# Patient Record
Sex: Female | Born: 2005 | Race: White | Hispanic: No | Marital: Single | Smoking: Never smoker
Health system: Southern US, Community
[De-identification: ages and names within clinical notes are randomized; demographics above are authoritative.]

## PROBLEM LIST (undated history)

## (undated) DIAGNOSIS — F909 Attention-deficit hyperactivity disorder, unspecified type: Secondary | ICD-10-CM

## (undated) DIAGNOSIS — F319 Bipolar disorder, unspecified: Secondary | ICD-10-CM

## (undated) DIAGNOSIS — F32A Depression, unspecified: Secondary | ICD-10-CM

## (undated) DIAGNOSIS — F419 Anxiety disorder, unspecified: Secondary | ICD-10-CM

## (undated) DIAGNOSIS — R51 Headache: Secondary | ICD-10-CM

## (undated) DIAGNOSIS — R519 Headache, unspecified: Secondary | ICD-10-CM

---
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
## 2024-05-19 ENCOUNTER — Emergency Department
Admission: EM | Admit: 2024-05-19 | Discharge: 2024-05-20 | Disposition: A | Discharge status: Other Acute Inpatient Hospital | Payer: MEDICAID | Attending: Emergency Medicine | Admitting: Emergency Medicine

## 2024-05-19 DIAGNOSIS — X838XXA Intentional self-harm by other specified means, initial encounter: Secondary | ICD-10-CM | POA: Diagnosis not present

## 2024-05-19 DIAGNOSIS — F39 Unspecified mood [affective] disorder: Secondary | ICD-10-CM | POA: Diagnosis not present

## 2024-05-19 DIAGNOSIS — T50902A Poisoning by unspecified drugs, medicaments and biological substances, intentional self-harm, initial encounter: Secondary | ICD-10-CM | POA: Diagnosis present

## 2024-05-19 DIAGNOSIS — F3481 Disruptive mood dysregulation disorder: Secondary | ICD-10-CM | POA: Insufficient documentation

## 2024-05-19 DIAGNOSIS — T38892A Poisoning by other hormones and synthetic substitutes, intentional self-harm, initial encounter: Secondary | ICD-10-CM | POA: Insufficient documentation

## 2024-05-19 DIAGNOSIS — F29 Unspecified psychosis not due to a substance or known physiological condition: Secondary | ICD-10-CM | POA: Diagnosis not present

## 2024-05-19 DIAGNOSIS — Z79899 Other long term (current) drug therapy: Secondary | ICD-10-CM | POA: Diagnosis not present

## 2024-05-19 DIAGNOSIS — R4689 Other symptoms and signs involving appearance and behavior: Secondary | ICD-10-CM | POA: Diagnosis present

## 2024-05-19 NOTE — ED Notes (Signed)
 Past Medical History:  Diagnosis Date   ADHD (attention deficit hyperactivity disorder)    Anxiety    Bipolar 1 disorder (HCC)    Depression    Headache

## 2024-05-19 NOTE — ED Notes (Signed)
 Janice Dixon, Poison Control contacted. Recommendations for patient include EKG, VS, and Tylenol  levels at 0330. Expect mild drowsiness, but if patient becomes sedative, question additional medications.

## 2024-05-20 ENCOUNTER — Encounter: Payer: Self-pay | Admitting: Psychiatry

## 2024-05-20 ENCOUNTER — Encounter: Payer: Self-pay | Admitting: Emergency Medicine

## 2024-05-20 ENCOUNTER — Other Ambulatory Visit: Payer: Self-pay

## 2024-05-20 ENCOUNTER — Inpatient Hospital Stay
Admission: AD | Admit: 2024-05-20 | Discharge: 2024-05-25 | DRG: 885 | Disposition: A | Discharge status: Home / Self Care | Payer: MEDICAID | Source: Intra-hospital | Attending: Psychiatry | Admitting: Psychiatry

## 2024-05-20 DIAGNOSIS — E66812 Obesity, class 2: Secondary | ICD-10-CM | POA: Diagnosis present

## 2024-05-20 DIAGNOSIS — F3481 Disruptive mood dysregulation disorder: Secondary | ICD-10-CM | POA: Diagnosis not present

## 2024-05-20 DIAGNOSIS — F17290 Nicotine dependence, other tobacco product, uncomplicated: Secondary | ICD-10-CM | POA: Diagnosis present

## 2024-05-20 DIAGNOSIS — F419 Anxiety disorder, unspecified: Secondary | ICD-10-CM | POA: Diagnosis present

## 2024-05-20 DIAGNOSIS — F39 Unspecified mood [affective] disorder: Secondary | ICD-10-CM

## 2024-05-20 DIAGNOSIS — Z880 Allergy status to penicillin: Secondary | ICD-10-CM

## 2024-05-20 DIAGNOSIS — Z9151 Personal history of suicidal behavior: Secondary | ICD-10-CM | POA: Diagnosis not present

## 2024-05-20 DIAGNOSIS — Z6836 Body mass index (BMI) 36.0-36.9, adult: Secondary | ICD-10-CM | POA: Diagnosis not present

## 2024-05-20 DIAGNOSIS — Z9152 Personal history of nonsuicidal self-harm: Secondary | ICD-10-CM | POA: Diagnosis not present

## 2024-05-20 DIAGNOSIS — F3181 Bipolar II disorder: Principal | ICD-10-CM | POA: Diagnosis present

## 2024-05-20 DIAGNOSIS — T1491XA Suicide attempt, initial encounter: Secondary | ICD-10-CM | POA: Diagnosis present

## 2024-05-20 DIAGNOSIS — F909 Attention-deficit hyperactivity disorder, unspecified type: Secondary | ICD-10-CM | POA: Diagnosis present

## 2024-05-20 DIAGNOSIS — R519 Headache, unspecified: Secondary | ICD-10-CM | POA: Diagnosis present

## 2024-05-20 DIAGNOSIS — F319 Bipolar disorder, unspecified: Principal | ICD-10-CM | POA: Diagnosis present

## 2024-05-20 DIAGNOSIS — F1290 Cannabis use, unspecified, uncomplicated: Secondary | ICD-10-CM | POA: Diagnosis present

## 2024-05-20 DIAGNOSIS — Z79899 Other long term (current) drug therapy: Secondary | ICD-10-CM | POA: Diagnosis not present

## 2024-05-20 DIAGNOSIS — R4586 Emotional lability: Secondary | ICD-10-CM | POA: Diagnosis present

## 2024-05-20 LAB — COMPREHENSIVE METABOLIC PANEL WITH GFR
ALT: 14 U/L (ref 0–44)
AST: 22 U/L (ref 15–41)
Albumin: 3.8 g/dL (ref 3.5–5.0)
Alkaline Phosphatase: 62 U/L (ref 38–126)
Anion gap: 12 (ref 5–15)
BUN: 10 mg/dL (ref 6–20)
CO2: 21 mmol/L — ABNORMAL LOW (ref 22–32)
Calcium: 9 mg/dL (ref 8.9–10.3)
Chloride: 108 mmol/L (ref 98–111)
Creatinine, Ser: 0.48 mg/dL (ref 0.44–1.00)
GFR, Estimated: >60 mL/min (ref >60)
Glucose, Bld: 113 mg/dL — ABNORMAL HIGH (ref 70–99)
Potassium: 3.3 mmol/L — ABNORMAL LOW (ref 3.5–5.1)
Sodium: 141 mmol/L (ref 135–145)
Total Bilirubin: 0.5 mg/dL (ref 0.0–1.2)
Total Protein: 6.4 g/dL — ABNORMAL LOW (ref 6.5–8.1)

## 2024-05-20 LAB — CBC WITH DIFFERENTIAL/PLATELET
Abs Immature Granulocytes: 0.01 K/uL (ref 0.00–0.07)
Basophils Absolute: 0.1 K/uL (ref 0.0–0.1)
Basophils Relative: 1 %
Eosinophils Absolute: 0.1 K/uL (ref 0.0–0.5)
Eosinophils Relative: 1 %
HCT: 37 % (ref 36.0–46.0)
Hemoglobin: 12.6 g/dL (ref 12.0–15.0)
Immature Granulocytes: 0 %
Lymphocytes Relative: 42 %
Lymphs Abs: 2.7 K/uL (ref 0.7–4.0)
MCH: 28.6 pg (ref 26.0–34.0)
MCHC: 34.1 g/dL (ref 30.0–36.0)
MCV: 84.1 fL (ref 80.0–100.0)
Monocytes Absolute: 0.5 K/uL (ref 0.1–1.0)
Monocytes Relative: 8 %
Neutro Abs: 3.1 K/uL (ref 1.7–7.7)
Neutrophils Relative %: 48 %
Platelets: 298 K/uL (ref 150–400)
RBC: 4.4 MIL/uL (ref 3.87–5.11)
RDW: 12.9 % (ref 11.5–15.5)
WBC: 6.4 K/uL (ref 4.0–10.5)
nRBC: 0 % (ref 0.0–0.2)

## 2024-05-20 LAB — ACETAMINOPHEN LEVEL
Acetaminophen (Tylenol), Serum: <10 ug/mL — ABNORMAL LOW (ref 10–30)
Acetaminophen (Tylenol), Serum: <10 ug/mL — ABNORMAL LOW (ref 10–30)

## 2024-05-20 LAB — URINE DRUG SCREEN, QUALITATIVE (ARMC ONLY)
Amphetamines, Ur Screen: NOT DETECTED
Barbiturates, Ur Screen: NOT DETECTED
Benzodiazepine, Ur Scrn: NOT DETECTED
Cannabinoid 50 Ng, Ur ~~LOC~~: NOT DETECTED
Cocaine Metabolite,Ur ~~LOC~~: NOT DETECTED
MDMA (Ecstasy)Ur Screen: NOT DETECTED
Methadone Scn, Ur: NOT DETECTED
Opiate, Ur Screen: NOT DETECTED
Phencyclidine (PCP) Ur S: NOT DETECTED
Tricyclic, Ur Screen: NOT DETECTED

## 2024-05-20 LAB — CBG MONITORING, ED: Glucose-Capillary: 107 mg/dL — ABNORMAL HIGH (ref 70–99)

## 2024-05-20 LAB — POC URINE PREG, ED: Preg Test, Ur: NEGATIVE

## 2024-05-20 LAB — ETHANOL: Alcohol, Ethyl (B): <15 mg/dL (ref <15)

## 2024-05-20 LAB — SALICYLATE LEVEL: Salicylate Lvl: <7 mg/dL — ABNORMAL LOW (ref 7.0–30.0)

## 2024-05-20 MED ORDER — IBUPROFEN 600 MG PO TABS
600.0000 mg | ORAL_TABLET | Freq: Once | ORAL | Status: AC
  Administered 2024-05-20: 600 mg via ORAL
  Filled 2024-05-20: qty 1

## 2024-05-20 MED ORDER — HALOPERIDOL LACTATE 5 MG/ML IJ SOLN: 5.0000 mg | Freq: Three times a day (TID) | INTRAMUSCULAR | Status: DC | PRN

## 2024-05-20 MED ORDER — POTASSIUM CHLORIDE CRYS ER 20 MEQ PO TBCR
40.0000 meq | EXTENDED_RELEASE_TABLET | Freq: Once | ORAL | Status: AC
  Administered 2024-05-20: 40 meq via ORAL
  Filled 2024-05-20: qty 2

## 2024-05-20 MED ORDER — HALOPERIDOL 5 MG PO TABS: 5.0000 mg | ORAL_TABLET | Freq: Three times a day (TID) | ORAL | Status: DC | PRN

## 2024-05-20 MED ORDER — TRAZODONE HCL 50 MG PO TABS
50.0000 mg | ORAL_TABLET | Freq: Every evening | ORAL | Status: DC | PRN
  Administered 2024-05-21 – 2024-05-24 (×4): 50 mg via ORAL
  Filled 2024-05-20 (×4): qty 1

## 2024-05-20 MED ORDER — HYDROXYZINE HCL 25 MG PO TABS: 50.0000 mg | ORAL_TABLET | Freq: Four times a day (QID) | ORAL | Status: DC | PRN

## 2024-05-20 MED ORDER — DIPHENHYDRAMINE HCL 50 MG/ML IJ SOLN: 50.0000 mg | Freq: Three times a day (TID) | INTRAMUSCULAR | Status: DC | PRN

## 2024-05-20 MED ORDER — HALOPERIDOL LACTATE 5 MG/ML IJ SOLN: 10.0000 mg | Freq: Three times a day (TID) | INTRAMUSCULAR | Status: DC | PRN

## 2024-05-20 MED ORDER — ACETAMINOPHEN 325 MG PO TABS
650.0000 mg | ORAL_TABLET | Freq: Once | ORAL | Status: AC
  Administered 2024-05-20: 650 mg via ORAL
  Filled 2024-05-20: qty 2

## 2024-05-20 MED ORDER — ZIPRASIDONE MESYLATE 20 MG IM SOLR: 10.0000 mg | Freq: Four times a day (QID) | INTRAMUSCULAR | Status: DC | PRN

## 2024-05-20 MED ORDER — LORAZEPAM 2 MG/ML IJ SOLN: 2.0000 mg | Freq: Three times a day (TID) | INTRAMUSCULAR | Status: DC | PRN

## 2024-05-20 MED ORDER — DIPHENHYDRAMINE HCL 25 MG PO CAPS
50.0000 mg | ORAL_CAPSULE | Freq: Three times a day (TID) | ORAL | Status: DC | PRN
  Administered 2024-05-24: 50 mg via ORAL
  Filled 2024-05-20: qty 2

## 2024-05-20 MED ORDER — DIPHENHYDRAMINE HCL 50 MG/ML IJ SOLN: 50.0000 mg | Freq: Four times a day (QID) | INTRAMUSCULAR | Status: DC | PRN

## 2024-05-20 MED ORDER — MAGNESIUM HYDROXIDE 400 MG/5ML PO SUSP: 30.0000 mL | Freq: Every day | ORAL | Status: DC | PRN

## 2024-05-20 MED ORDER — ACETAMINOPHEN 325 MG PO TABS
650.0000 mg | ORAL_TABLET | Freq: Four times a day (QID) | ORAL | Status: DC | PRN
  Administered 2024-05-21 – 2024-05-25 (×3): 650 mg via ORAL
  Filled 2024-05-20 (×3): qty 2

## 2024-05-20 MED ORDER — ALUM & MAG HYDROXIDE-SIMETH 200-200-20 MG/5ML PO SUSP: 30.0000 mL | ORAL | Status: DC | PRN

## 2024-05-20 MED ORDER — DIAZEPAM 5 MG/ML IJ SOLN: 10.0000 mg | Freq: Four times a day (QID) | INTRAMUSCULAR | Status: DC | PRN

## 2024-05-20 NOTE — ED Notes (Addendum)
 Patient's grandmother presented to ED to visit patient and check on her.  Reassurance given that patient had a good night and was currently sleeping.  Took phone number and will have patient call her once she wakes.  Patient's mother, Leotis:  [phone], called to check on patient.  Verified patient was in the ED and safe and had a good overnight. Will have patient call mom once she wakes.

## 2024-05-20 NOTE — ED Notes (Addendum)
 Janice Dixon, pt aunts called and requested for update on pt and left phone number [phone]. Writer explained that information could not be given unless pt provided consent and that message would be given to pt if she wished to contact during allotted phone privileged times.

## 2024-05-20 NOTE — ED Provider Notes (Signed)
 [facility] Provider Note    Event Date/Time   First MD Initiated Contact with Patient 05/19/24 2347     (approximate)   History   No chief complaint on file.   HPI  Janice Dixon is a 18 y.o. female  ***   History provided by ***.    Past Medical History:  Diagnosis Date   ADHD (attention deficit hyperactivity disorder)    Anxiety    Bipolar 1 disorder (HCC)    Depression    Headache     No past surgical history on file.  MEDICATIONS:  Prior to Admission medications   Medication Sig Start Date End Date Taking? Authorizing Provider  ARIPiprazole  (ABILIFY ) 15 MG tablet Take 1 tablet (15 mg total) by mouth at bedtime. 10/24/21 11/23/21  Raliegh Marsa RAMAN, DO  hydrOXYzine  (ATARAX ) 50 MG tablet Take 1 tablet (50 mg total) by mouth at bedtime. 10/24/21   Pashayan, Alexander S, DO  melatonin 5 MG TABS Take 5 mg by mouth at bedtime.    [provider]  Oxcarbazepine  (TRILEPTAL ) 300 MG tablet Take 1 tablet (300 mg total) by mouth 2 (two) times daily. 10/24/21 11/23/21  Raliegh Marsa RAMAN, DO    Physical Exam   Triage Vital Signs: ED Triage Vitals  Encounter Vitals Group     BP      Girls Systolic BP Percentile      Girls Diastolic BP Percentile      Boys Systolic BP Percentile      Boys Diastolic BP Percentile      Pulse      Resp      Temp      Temp src      SpO2      Weight      Height      Head Circumference      Peak Flow      Pain Score      Pain Loc      Pain Education      Exclude from Growth Chart     Most recent vital signs: There were no vitals filed for this visit.  CONSTITUTIONAL: Alert, responds appropriately to questions. Well-appearing; well-nourished HEAD: Normocephalic, atraumatic EYES: Conjunctivae clear, pupils appear equal, sclera nonicteric ENT: normal nose; moist mucous membranes NECK: Supple, normal ROM CARD: RRR; S1 and S2 appreciated RESP: Normal chest excursion without splinting or  tachypnea; breath sounds clear and equal bilaterally; no wheezes, no rhonchi, no rales, no hypoxia or respiratory distress, speaking full sentences ABD/GI: Non-distended; soft, non-tender, no rebound, no guarding, no peritoneal signs BACK: The back appears normal EXT: Normal ROM in all joints; no deformity noted, no edema SKIN: Normal color for age and race; warm; no rash on exposed skin NEURO: Moves all extremities equally, normal speech PSYCH: The patient's mood and manner are appropriate.   ED Results / Procedures / Treatments   LABS: (all labs ordered are listed, but only abnormal results are displayed) Labs Reviewed - No data to display   EKG:  EKG Interpretation Date/Time:    Ventricular Rate:    PR Interval:    QRS Duration:    QT Interval:    QTC Calculation:   R Axis:      Text Interpretation:           RADIOLOGY: My personal review and interpretation of imaging:  ***  I have personally reviewed all radiology reports.   No results found.   PROCEDURES:  Critical  Care performed: {CriticalCareYesNo:19197::Yes, see critical care procedure note(s),No}   CRITICAL CARE Performed by: Josette Sink   Total critical care time: *** minutes  Critical care time was exclusive of separately billable procedures and treating other patients.  Critical care was necessary to treat or prevent imminent or life-threatening deterioration.  Critical care was time spent personally by me on the following activities: development of treatment plan with patient and/or surrogate as well as nursing, discussions with consultants, evaluation of patient's response to treatment, examination of patient, obtaining history from patient or surrogate, ordering and performing treatments and interventions, ordering and review of laboratory studies, ordering and review of radiographic studies, pulse oximetry and re-evaluation of patient's condition.   Procedures    IMPRESSION / MDM /  ASSESSMENT AND PLAN / ED COURSE  I reviewed the triage vital signs and the nursing notes.    ***  The patient is on the cardiac monitor to evaluate for evidence of arrhythmia and/or significant heart rate changes.   DIFFERENTIAL DIAGNOSIS (includes but not limited to):   ***   Patient's presentation is most consistent with {EM COPA:27473}   PLAN: ***   MEDICATIONS GIVEN IN ED: Medications - No data to display   ED COURSE:  ***   CONSULTS:  ***   OUTSIDE RECORDS REVIEWED:  ***       FINAL CLINICAL IMPRESSION(S) / ED DIAGNOSES   Final diagnoses:  None     Rx / DC Orders   ED Discharge Orders     None        Note:  This document was prepared using Dragon voice recognition software and may include unintentional dictation errors.

## 2024-05-20 NOTE — Consult Note (Signed)
 Janice Dixon  Patient Name: Janice Dixon MRN: [id number] DOB: [birth year] DATE OF Consult: 05/20/2024  PRIMARY PSYCHIATRIC DIAGNOSES   1.  Mood Disorder   RECOMMENDATIONS  Recommendations: Medication recommendations: Given that patient has been off medication for several years, for now do not recommend that begin any scheduled dose of medicine in ED, so that inpatient providers can make independent decisions with patient about treatment course.  PRN medications ordered:  Hydroxyzine , 50 mg q6h PRN anxiety; for agitatation/aggression, Geodon , 10 mg IM q6h PRN and Valium , 10 mg IM q6h PRN, and Benadryl , 50 mg IM q6h PRN. Non-Medication/therapeutic recommendations: Patient fails to recognize the seriousness of the action she took, and she remains emotionally labile.  She should remain on suicide precautions until she can be safely admitted to a psychiatric facility.  Continue with matter-of-fact emotional support in ED pending discharge Is inpatient psychiatric hospitalization recommended for this patient? Yes (Explain why): AS a direct result of her having become angry with her father for having disappointed her, she took an overdose of melatonin, even though she knew that such an action would cause her to be admitted for safety and treatment (has had 16 admits, with multiple for overdose).  Give patient's unwillingness to even consider the seriousness of this attempt, she is at high risk of another impulsive action that could lead even more serious consequences.  Thus, she meets IVC criteria and should be admitted for stabilization and treatment.  If patient does wish to stay voluntarily after considering the consequences of her action, then she could be admitted on a voluntary basis.  However, she should not be allowed to sign out AMA.. Is another care setting recommended for this patient? (examples may include Crisis Stabilization Unit, Residential/Recovery Treatment, ALF/SNF,  Memory Care Unit)  No (Explain why): See above From a psychiatric perspective, is this patient appropriate for discharge to an outpatient setting/resource or other less restrictive environment for continued care?  No (Explain why): See above Follow-Up Telepsychiatry C/L services: We will sign off for now. Please re-consult our service if needed for any concerning changes in the patient's condition, discharge planning, or questions. Communication: Treatment team members (and family members if applicable) who were involved in treatment/care discussions and planning, and with whom we spoke or engaged with via secure text/chat, include the following: Secure message sent to Dr. Neomi, ED attending, along with staff, outlining above recommendations.  Thank you for involving us  in the care of this patient. If you have any additional questions or concerns, please call [phone] and ask for the provider on-call.   TELEPSYCHIATRY ATTESTATION & CONSENT   As the provider for this telehealth consult, I attest that I verified the patient's identity using two separate identifiers, introduced myself to the patient, provided my credentials, disclosed my location, and performed this encounter via a HIPAA-compliant, real-time, face-to-face, two-way, interactive audio and video platform and with the full consent and agreement of the patient (or guardian as applicable.)   Patient physical location: [facility] ED. Telehealth provider physical location: home office in state of [place] .  Video start time:  0330h EDT Video end time: 0345h EDT  Total time spent in this encounter was 30 minutes, including record review, clinical interview, behavior observations, discussion of impressions and recommendations (including medications and hospitalization), and consultation/communication with relevant parties   IDENTIFYING DATA  Janice Dixon is a 18 y.o. year-old female for whom a psychiatric consultation has been  ordered by the primary provider. The patient  was identified using two separate identifiers.  CHIEF COMPLAINT/REASON FOR CONSULT   I admit that I took an overdose of medication, but I did it to get attention, not to kill myself.   HISTORY OF PRESENT ILLNESS (HPI)  The patient presents with a long history of psychiatric disturbance related to mood instability and early childhood trauma.  Patient reports having had sixteen admissions during her teen years for suicide attempts, many of them overdoses.  Has not been in treatment for several years:  has not seen a therapist, nor has she taking any psychiatric medications.    Tonight, patient straightforwardly admits that she was distressed that her father would not answer her calls, and she was feeling that she was not loved by anyone in the family.  She then overdosed on the melatonin, and it was only after that did she notify her grandparents, with whom she was staying, about her overdose.  Patient does seem to be struggling to understand the seriousness of her actions, and she sees no connection between the acute disappointment with her father, which is likely not to decrease anytime soon, and her self-destructive behavior.  No homicidal ideation.  No psychotic sx's.  Denies drug/EtOH usage.  SABRABAL and UDS negative   PAST PSYCHIATRIC HISTORY   Otherwise as per HPI above.  PAST MEDICAL HISTORY  Past Medical History:  Diagnosis Date   ADHD (attention deficit hyperactivity disorder)    Anxiety    Bipolar 1 disorder (HCC)    Depression    Headache      HOME MEDICATIONS  Facility Ordered Medications  Medication   [COMPLETED] potassium chloride  SA (KLOR-CON  M) CR tablet 40 mEq   hydrOXYzine  (ATARAX ) tablet 50 mg   ziprasidone  (GEODON ) injection 10 mg   diazepam  (VALIUM ) injection 10 mg   diphenhydrAMINE  (BENADRYL ) injection 50 mg   PTA Medications  Medication Sig   melatonin 5 MG TABS Take 5 mg by mouth at bedtime.   hydrOXYzine  (ATARAX ) 50  MG tablet Take 1 tablet (50 mg total) by mouth at bedtime.   none  ALLERGIES  Allergies  Allergen Reactions   Penicillins Hives and Rash    Per father patient had a rash when taking pencillin but can and has in the past taken amoxillicin     SOCIAL & SUBSTANCE USE HISTORY  Social History   Socioeconomic History   Marital status: Single    Spouse name: Not on file   Number of children: Not on file   Years of education: Not on file   Highest education level: Not on file  Occupational History   Not on file  Tobacco Use   Smoking status: Never   Smokeless tobacco: Never  Vaping Use   Vaping status: Every Day   Substances: Nicotine , Flavoring  Substance and Sexual Activity   Alcohol use: Never   Drug use: Yes    Types: Marijuana   Sexual activity: Not Currently    Birth control/protection: Abstinence  Other Topics Concern   Not on file  Social History Narrative   Not on file   Social Drivers of Health   Financial Resource Strain: Not on file  Food Insecurity: Not on file  Transportation Needs: Not on file  Physical Activity: Not on file  Stress: Not on file  Social Connections: Not on file   Social History   Tobacco Use  Smoking Status Never  Smokeless Tobacco Never   Social History   Substance and Sexual Activity  Alcohol Use  Never   Social History   Substance and Sexual Activity  Drug Use Yes   Types: Marijuana    Additional pertinent information patient lives with her paternal grandfather, but her maternal grandparents live next door.  Had been working at a [employer], but lost job, and has been unemployed for a few months.SABRA  FAMILY HISTORY  History reviewed. No pertinent family history. Family Psychiatric History (if known):  Patient did not report  MENTAL STATUS EXAM (MSE)  Mental Status Exam: General Appearance: Fairly Groomed  Orientation:  Full (Time, Place, and Person)  Memory:  Immediate;   Fair Recent;   Fair Remote;   Fair   Concentration:  Concentration: Fair and Attention Span: Fair  Recall:  Fair  Attention  Fair  Eye Contact:  Minimal  Speech:  Clear and Coherent and Normal Rate  Language:  Good  Volume:  Normal  Mood: I was just upset and wanted to get attention.  I didn't want to die  Affect:  Inappropriate and Labile  Thought Process:  Descriptions of Associations: Tangential  Thought Content:  Rumination  Suicidal Thoughts:  Yes.  with intent/plan  Homicidal Thoughts:  No  Judgement:  Poor  Insight:  Shallow  Psychomotor Activity:  Normal  Akathisia:  Negative  Fund of Knowledge:  Fair    Assets:  Engineer, maintenance Social Support  Cognition:  WNL  ADL's:  Intact  AIMS (if indicated):       VITALS  Blood pressure 109/79, pulse 89, temperature 98.3 F (36.8 C), temperature source Oral, resp. rate 16, height 5' 7 (1.702 m), weight 93 kg, SpO2 98%.  LABS  Admission on 05/19/2024  Component Date Value Ref Range Status   Glucose-Capillary 05/20/2024 107 (H)  70 - 99 mg/dL Final   Glucose reference range applies only to samples taken after fasting for at least 8 hours.   Sodium 05/20/2024 141  135 - 145 mmol/L Final   Potassium 05/20/2024 3.3 (L)  3.5 - 5.1 mmol/L Final   Chloride 05/20/2024 108  98 - 111 mmol/L Final   CO2 05/20/2024 21 (L)  22 - 32 mmol/L Final   Glucose, Bld 05/20/2024 113 (H)  70 - 99 mg/dL Final   Glucose reference range applies only to samples taken after fasting for at least 8 hours.   BUN 05/20/2024 10  6 - 20 mg/dL Final   Creatinine, Ser 05/20/2024 0.48  0.44 - 1.00 mg/dL Final   Calcium 92/68/7974 9.0  8.9 - 10.3 mg/dL Final   Total Protein 92/68/7974 6.4 (L)  6.5 - 8.1 g/dL Final   Albumin 92/68/7974 3.8  3.5 - 5.0 g/dL Final   AST 92/68/7974 22  15 - 41 U/L Final   ALT 05/20/2024 14  0 - 44 U/L Final   Alkaline Phosphatase 05/20/2024 62  38 - 126 U/L Final   Total Bilirubin 05/20/2024 0.5  0.0 - 1.2 mg/dL Final   GFR, Estimated 05/20/2024 >60   >60 mL/min Final   Comment: (Dixon) Calculated using the CKD-EPI Creatinine Equation (2021)    Anion gap 05/20/2024 12  5 - 15 Final   Performed at [facility], [address] [place], [place] [address]   Salicylate Lvl 05/20/2024 <7.0 (L)  7.0 - 30.0 mg/dL Final   Performed at [facility], [address] [place], [place] [address]   Alcohol, Ethyl (B) 05/20/2024 <15  <15 mg/dL Final   Comment: (Dixon) For medical purposes only. Performed at [facility]  [facility], [address] [place], [place] [address]    Tricyclic, Ur Screen 05/20/2024 NONE DETECTED  NONE DETECTED Final   Amphetamines, Ur Screen 05/20/2024 NONE DETECTED  NONE DETECTED Final   MDMA (Ecstasy)Ur Screen 05/20/2024 NONE DETECTED  NONE DETECTED Final   Cocaine Metabolite,Ur [place] 05/20/2024 NONE DETECTED  NONE DETECTED Final   Opiate, Ur Screen 05/20/2024 NONE DETECTED  NONE DETECTED Final   Phencyclidine (PCP) Ur S 05/20/2024 NONE DETECTED  NONE DETECTED Final   Cannabinoid 50 Ng, Ur [place] 05/20/2024 NONE DETECTED  NONE DETECTED Final   Barbiturates, Ur Screen 05/20/2024 NONE DETECTED  NONE DETECTED Final   Benzodiazepine, Ur Scrn 05/20/2024 NONE DETECTED  NONE DETECTED Final   Methadone Scn, Ur 05/20/2024 NONE DETECTED  NONE DETECTED Final   Comment: (Dixon) Tricyclics + metabolites, urine    Cutoff 1000 ng/mL Amphetamines + metabolites, urine  Cutoff 1000 ng/mL MDMA (Ecstasy), urine              Cutoff 500 ng/mL Cocaine Metabolite, urine          Cutoff 300 ng/mL Opiate + metabolites, urine        Cutoff 300 ng/mL Phencyclidine (PCP), urine         Cutoff 25 ng/mL Cannabinoid, urine                 Cutoff 50 ng/mL Barbiturates + metabolites, urine  Cutoff 200 ng/mL Benzodiazepine, urine              Cutoff 200 ng/mL Methadone, urine                   Cutoff 300 ng/mL  The urine drug screen provides only a preliminary, unconfirmed analytical test result and should not be used for  non-medical purposes. Clinical consideration and professional judgment should be applied to any positive drug screen result due to possible interfering substances. A more specific alternate chemical method must be used in order to obtain a confirmed analytical result. Gas chromatography / mass spectrometry (GC/MS) is the preferred confirm                          atory method. Performed at [facility], [address] [place], [place] [address]    WBC 05/20/2024 6.4  4.0 - 10.5 K/uL Final   RBC 05/20/2024 4.40  3.87 - 5.11 MIL/uL Final   Hemoglobin 05/20/2024 12.6  12.0 - 15.0 g/dL Final   HCT 92/68/7974 37.0  36.0 - 46.0 % Final   MCV 05/20/2024 84.1  80.0 - 100.0 fL Final   MCH 05/20/2024 28.6  26.0 - 34.0 pg Final   MCHC 05/20/2024 34.1  30.0 - 36.0 g/dL Final   RDW 92/68/7974 12.9  11.5 - 15.5 % Final   Platelets 05/20/2024 298  150 - 400 K/uL Final   nRBC 05/20/2024 0.0  0.0 - 0.2 % Final   Neutrophils Relative % 05/20/2024 48  % Final   Neutro Abs 05/20/2024 3.1  1.7 - 7.7 K/uL Final   Lymphocytes Relative 05/20/2024 42  % Final   Lymphs Abs 05/20/2024 2.7  0.7 - 4.0 K/uL Final   Monocytes Relative 05/20/2024 8  % Final   Monocytes Absolute 05/20/2024 0.5  0.1 - 1.0 K/uL Final   Eosinophils Relative 05/20/2024 1  % Final   Eosinophils Absolute 05/20/2024 0.1  0.0 - 0.5 K/uL Final   Basophils Relative 05/20/2024 1  % Final  Basophils Absolute 05/20/2024 0.1  0.0 - 0.1 K/uL Final   Immature Granulocytes 05/20/2024 0  % Final   Abs Immature Granulocytes 05/20/2024 0.01  0.00 - 0.07 K/uL Final   Performed at [facility], [address] [place], [place] [address]   Preg Test, Ur 05/20/2024 Negative  Negative Final   Acetaminophen  (Tylenol ), Serum 05/20/2024 <10 (L)  10 - 30 ug/mL Final   Comment: (Dixon) Therapeutic concentrations vary significantly. A range of 10-30 ug/mL  may be an effective concentration for many patients. However, some  are best  treated at concentrations outside of this range. Acetaminophen  concentrations >150 ug/mL at 4 hours after ingestion  and >50 ug/mL at 12 hours after ingestion are often associated with  toxic reactions.  Performed at [facility], [address] [place], [place] [address]     PSYCHIATRIC REVIEW OF SYSTEMS (ROS)  ROS: Notable for the following relevant positive findings: Review of Systems  Constitutional: Negative.   HENT: Negative.    Eyes: Negative.   Respiratory: Negative.    Cardiovascular: Negative.   Gastrointestinal: Negative.   Genitourinary: Negative.   Musculoskeletal: Negative.   Skin: Negative.   Neurological: Negative.   Endo/Heme/Allergies: Negative.   Psychiatric/Behavioral:  Positive for depression and suicidal ideas. The patient is nervous/anxious.     Additional findings:      Musculoskeletal: No abnormal movements observed      Gait & Station: Normal      Pain Screening: Denies      Nutrition & Dental Concerns: Reviewed   RISK FORMULATION/ASSESSMENT  Is the patient experiencing any suicidal or homicidal ideations:         Explain if yes: patient states that she is not suicidal, but then says that when she took the medication purposely in order to get attention,  she was not trying to commit suicide  even though she had been hospitalized multiple times for overdoses when she was a teen.  At best, patient's actions demonstrate marked minimization of her actions, which still puts her at high risk for another impulsive action that could be even more consequential.  Protective factors considered for safety management:   Patient was with her grandparents, and she took the overdose anyway, making no attempt to seek their support when she was distressed.  Risk factors/concerns considered for safety management:  Prior attempt Depression Recent loss Access to lethal means Hopelessness Impulsivity Isolation Unwillingness to seek  help Unmarried  Is there a safety management plan with the patient and treatment team to minimize risk factors and promote protective factors: No            Explain: As above, patient refuses to acknowledge the serious nature of her actions, and she still is at rest for another overdose, gven her mood lability  Is crisis care placement or psychiatric hospitalization recommended: Yes     Based on my current evaluation and risk assessment, patient is determined at this time to be at:  High risk  *RISK ASSESSMENT Risk assessment is a dynamic process; it is possible that this patient's condition, and risk level, may change. This should be re-evaluated and managed over time as appropriate. Please re-consult psychiatric consult services if additional assistance is needed in terms of risk assessment and management. If your team decides to discharge this patient, please advise the patient how to best access emergency psychiatric services, or to call 911, if their condition worsens or they feel unsafe in any way.   Adriana  JINNY Pontes, MD Telepsychiatry Consult Services
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
## 2024-05-20 NOTE — Progress Notes (Signed)
 Received PT from [facility] ED for admission.  Pt overdose on melatonin because she didn't really feel loved.  Pt denied current SI HI AVH.  Pt denied physical symptoms.  Pt does have recent abrasions (scratches) on right forearm and scattered tattoos on skin assessment.  Pt lives with her grandmother and has a history of psych admission as an adolescent.  Pt denies any history of physical chronic illness.  Pt vapes at home but declined nicotine  replacement therapy.  Pt endorses a support system including family and girlfriend.  Pt goals are being better for myself and try new medicine.  Pt was oriented to the unit and procedures/expectations.  Continued monitoring for safety.    05/20/24 2200  Psych Admission Type (Psych Patients Only)  Admission Status Involuntary  Psychosocial Assessment  Patient Complaints Anxiety;Depression  Eye Contact Fair  Facial Expression Animated  Affect Apprehensive;Appropriate to circumstance  Speech Soft  Interaction Assertive  Motor Activity Slow  Appearance/Hygiene Body odor;Disheveled;In scrubs  Behavior Characteristics Cooperative;Appropriate to situation  Mood Anxious  Thought Process  Coherency WDL  Content WDL  Delusions None reported or observed  Perception WDL  Hallucination None reported or observed  Judgment Poor  Confusion None  Danger to Self  Current suicidal ideation? Denies  Danger to Others  Danger to Others None reported or observed

## 2024-05-20 NOTE — Plan of Care (Signed)
  Problem: Education: Goal: Verbalization of understanding the information provided will improve Outcome: Progressing   Problem: Education: Goal: Knowledge of  General Education information/materials will improve Outcome: Progressing

## 2024-05-20 NOTE — ED Notes (Signed)
 ED provider messaged through secure chat: do you think she can have something for pain? Her L hand is a little swollen and she got tylenol  earlier but she's still complaining of pain. She wanted pain medicine before she goes to the [facility].  ED provider ordered ibuprofen 

## 2024-05-20 NOTE — ED Notes (Signed)
 Vitals where completed.

## 2024-05-20 NOTE — ED Notes (Signed)
IVC/pending in-pt psych admit 

## 2024-05-20 NOTE — ED Notes (Signed)
 ED RN called Patty, Poison Control to give an update on pts status. Pending normal repeat Acetaminophen  levels, pt cleared from [employer]. MD Ward made aware of update via secure chat. Pt denies any symptoms at this time but appears to be anxious at the thought of being committed.

## 2024-05-20 NOTE — ED Notes (Signed)
IVC pending consult   

## 2024-05-20 NOTE — ED Notes (Signed)
 Brought pt her breakfast tray and she stated that she was not hungry at this time.

## 2024-05-20 NOTE — ED Triage Notes (Addendum)
 Pt BIB [facility] from home for ingestion of Melatonin. Per pt, 20 12mg  gummy tabs were consumed roughly 30 minutes ago. Pt feels as though no one loves or cares about her. Patient has not been taking prescribed medications for hx of bipolar, depression. Pt reports similar occurrence 3 years ago.   VS stable with EMS.   Past Medical History:  Diagnosis Date   ADHD (attention deficit hyperactivity disorder)    Anxiety    Bipolar 1 disorder (HCC)    Depression    Headache

## 2024-05-20 NOTE — ED Provider Notes (Incomplete)
 [facility] Provider Note    Event Date/Time   First MD Initiated Contact with Patient 05/19/24 2347     (approximate)   History   No chief complaint on file.   HPI  Janice Dixon is a 18 y.o. female  ***   Took twenty 12 mg melatonin about 1 hr PTA No meds 3 years   History provided by ***.    Past Medical History:  Diagnosis Date  . ADHD (attention deficit hyperactivity disorder)   . Anxiety   . Bipolar 1 disorder (HCC)   . Depression   . Headache     No past surgical history on file.  MEDICATIONS:  Prior to Admission medications   Medication Sig Start Date End Date Taking? Authorizing Provider  ARIPiprazole  (ABILIFY ) 15 MG tablet Take 1 tablet (15 mg total) by mouth at bedtime. 10/24/21 11/23/21  Raliegh Marsa RAMAN, DO  hydrOXYzine  (ATARAX ) 50 MG tablet Take 1 tablet (50 mg total) by mouth at bedtime. 10/24/21   Pashayan, Alexander S, DO  melatonin 5 MG TABS Take 5 mg by mouth at bedtime.    [provider]  Oxcarbazepine  (TRILEPTAL ) 300 MG tablet Take 1 tablet (300 mg total) by mouth 2 (two) times daily. 10/24/21 11/23/21  Raliegh Marsa RAMAN, DO    Physical Exam   Triage Vital Signs: ED Triage Vitals  Encounter Vitals Group     BP      Girls Systolic BP Percentile      Girls Diastolic BP Percentile      Boys Systolic BP Percentile      Boys Diastolic BP Percentile      Pulse      Resp      Temp      Temp src      SpO2      Weight      Height      Head Circumference      Peak Flow      Pain Score      Pain Loc      Pain Education      Exclude from Growth Chart     Most recent vital signs: There were no vitals filed for this visit.  CONSTITUTIONAL: Alert, responds appropriately to questions. Well-appearing; well-nourished HEAD: Normocephalic, atraumatic EYES: Conjunctivae clear, pupils appear equal, sclera nonicteric ENT: normal nose; moist mucous membranes NECK: Supple, normal ROM CARD: RRR; S1 and S2  appreciated RESP: Normal chest excursion without splinting or tachypnea; breath sounds clear and equal bilaterally; no wheezes, no rhonchi, no rales, no hypoxia or respiratory distress, speaking full sentences ABD/GI: Non-distended; soft, non-tender, no rebound, no guarding, no peritoneal signs BACK: The back appears normal EXT: Normal ROM in all joints; no deformity noted, no edema SKIN: Normal color for age and race; warm; no rash on exposed skin NEURO: Moves all extremities equally, normal speech PSYCH: The patient's mood and manner are appropriate.   ED Results / Procedures / Treatments   LABS: (all labs ordered are listed, but only abnormal results are displayed) Labs Reviewed - No data to display   EKG:  EKG Interpretation Date/Time:    Ventricular Rate:    PR Interval:    QRS Duration:    QT Interval:    QTC Calculation:   R Axis:      Text Interpretation:           RADIOLOGY: My personal review and interpretation of imaging:  ***  I have  personally reviewed all radiology reports.   No results found.   PROCEDURES:  Critical Care performed: {CriticalCareYesNo:19197::Yes, see critical care procedure note(s),No}   CRITICAL CARE Performed by: Josette Sink   Total critical care time: *** minutes  Critical care time was exclusive of separately billable procedures and treating other patients.  Critical care was necessary to treat or prevent imminent or life-threatening deterioration.  Critical care was time spent personally by me on the following activities: development of treatment plan with patient and/or surrogate as well as nursing, discussions with consultants, evaluation of patient's response to treatment, examination of patient, obtaining history from patient or surrogate, ordering and performing treatments and interventions, ordering and review of laboratory studies, ordering and review of radiographic studies, pulse oximetry and re-evaluation of  patient's condition.   Procedures    IMPRESSION / MDM / ASSESSMENT AND PLAN / ED COURSE  I reviewed the triage vital signs and the nursing notes.    ***  The patient is on the cardiac monitor to evaluate for evidence of arrhythmia and/or significant heart rate changes.   DIFFERENTIAL DIAGNOSIS (includes but not limited to):   ***   Patient's presentation is most consistent with {EM COPA:27473}   PLAN: ***   MEDICATIONS GIVEN IN ED: Medications - No data to display   ED COURSE:  ***   CONSULTS:  ***   OUTSIDE RECORDS REVIEWED:  ***       FINAL CLINICAL IMPRESSION(S) / ED DIAGNOSES   Final diagnoses:  None     Rx / DC Orders   ED Discharge Orders     None        Note:  This document was prepared using Dragon voice recognition software and may include unintentional dictation errors.

## 2024-05-20 NOTE — ED Notes (Signed)
 Pt escorted to interview room to speak with Psych provider. Security outside interview room.

## 2024-05-20 NOTE — ED Notes (Signed)
 MD notified of pt complaining of L hand pain. MD ordered tylenol 

## 2024-05-20 NOTE — ED Notes (Signed)
Lunch tray was provided to pt.

## 2024-05-21 DIAGNOSIS — F319 Bipolar disorder, unspecified: Secondary | ICD-10-CM | POA: Diagnosis not present

## 2024-05-21 LAB — LIPID PANEL
Cholesterol: 136 mg/dL (ref 0–200)
HDL: 32 mg/dL — ABNORMAL LOW (ref >40)
LDL Cholesterol: 85 mg/dL (ref 0–99)
Total CHOL/HDL Ratio: 4.3 ratio
Triglycerides: 97 mg/dL (ref <150)
VLDL: 19 mg/dL (ref 0–40)

## 2024-05-21 LAB — VITAMIN D 25 HYDROXY (VIT D DEFICIENCY, FRACTURES): Vit D, 25-Hydroxy: 24.57 ng/mL — ABNORMAL LOW (ref 30–100)

## 2024-05-21 LAB — TSH: TSH: 2.627 u[IU]/mL (ref 0.350–4.500)

## 2024-05-21 MED ORDER — NICOTINE POLACRILEX 2 MG MT GUM
2.0000 mg | CHEWING_GUM | OROMUCOSAL | Status: DC | PRN
  Administered 2024-05-21: 2 mg via ORAL
  Filled 2024-05-21 (×2): qty 1

## 2024-05-21 MED ORDER — ARIPIPRAZOLE 10 MG PO TABS
10.0000 mg | ORAL_TABLET | Freq: Every day | ORAL | Status: DC
Start: 2024-05-21 — End: 2024-05-25
  Administered 2024-05-21 – 2024-05-25 (×5): 10 mg via ORAL
  Filled 2024-05-21 (×5): qty 1

## 2024-05-21 MED ORDER — OXCARBAZEPINE 150 MG PO TABS
150.0000 mg | ORAL_TABLET | Freq: Two times a day (BID) | ORAL | Status: DC
Start: 2024-05-21 — End: 2024-05-25
  Administered 2024-05-21 – 2024-05-25 (×8): 150 mg via ORAL
  Filled 2024-05-21 (×9): qty 1

## 2024-05-21 NOTE — Group Note (Signed)
 Recreation Therapy Group Note   Group Topic:Other  Group Date: 05/21/2024 Start Time: 1030 End Time: 1130 Facilitators: Celestia Jeoffrey BRAVO, LRT, CTRS Location: Courtyard  Group Description: [employer]. LRT and patients played games of basketball, drew with chalk, and played corn hole while outside in the courtyard while getting fresh air and sunlight. Music was being played in the background. LRT and peers conversed about different games they have played before, what they do in their free time and anything else that is on their minds. LRT encouraged pts to drink water after being outside, sweating and getting their heart rate up.  Goal Area(s) Addressed: Patient will build on frustration tolerance skills. Patients will partake in a competitive play game with peers. Patients will gain knowledge of new leisure interest/hobby.    Affect/Mood: Appropriate   Participation Level: Active   Participation Quality: Independent   Behavior: Appropriate   Speech/Thought Process: Coherent   Insight: Good   Judgement: Good   Modes of Intervention: Activity   Patient Response to Interventions:  Receptive   Education Outcome:  Acknowledges education   Clinical Observations/Individualized Feedback: Ottis was active in their participation of session activities and group discussion. Pt interacted well with LRT and peers duration of session.    Plan: Continue to engage patient in RT group sessions 2-3x/week.   Jeoffrey BRAVO Celestia, LRT, CTRS 05/21/2024 11:51 AM

## 2024-05-21 NOTE — Group Note (Signed)
 Date:  05/21/2024 Time:  11:53 PM  Group Topic/Focus:  Self Care:   The focus of this group is to help patients understand the importance of self-care in order to improve or restore emotional, physical, spiritual, interpersonal, and financial health.    Participation Level:  Active  Participation Quality:  Appropriate  Affect:  Appropriate  Cognitive:  Appropriate  Insight: Appropriate  Engagement in Group:  Engaged  Modes of Intervention:  Education  Additional Comments:    Shatyra Becka L 05/21/2024, 11:53 PM

## 2024-05-21 NOTE — Group Note (Deleted)
 Date:  05/21/2024 Time:  11:41 PM  Group Topic/Focus:  Self Care:   The focus of this group is to help patients understand the importance of self-care in order to improve or restore emotional, physical, spiritual, interpersonal, and financial health.     Participation Level:  {BHH PARTICIPATION OZCZO:77735}  Participation Quality:  {BHH PARTICIPATION QUALITY:22265}  Affect:  {BHH AFFECT:22266}  Cognitive:  {BHH COGNITIVE:22267}  Insight: {BHH Insight2:20797}  Engagement in Group:  {BHH ENGAGEMENT IN HMNLE:77731}  Modes of Intervention:  {BHH MODES OF INTERVENTION:22269}  Additional Comments:  ***  Janice Dixon 05/21/2024, 11:41 PM

## 2024-05-21 NOTE — BH IP Treatment Plan (Signed)
 Interdisciplinary Treatment and Diagnostic Plan Update  05/21/2024 Time of Session: 10:20 AM Janice Dixon MRN: [id number]  Principal Diagnosis: Bipolar 1 disorder (HCC)  Secondary Diagnoses: Principal Problem:   Bipolar 1 disorder (HCC)   Current Medications:  Current Facility-Administered Medications  Medication Dose Route Frequency Provider Last Rate Last Admin   acetaminophen  (TYLENOL ) tablet 650 mg  650 mg Oral Q6H PRN Onuoha, Chinwendu V, NP   650 mg at 05/21/24 0832   alum & mag hydroxide-simeth (MAALOX/MYLANTA) 200-200-20 MG/5ML suspension 30 mL  30 mL Oral Q4H PRN Onuoha, Chinwendu V, NP       haloperidol  (HALDOL ) tablet 5 mg  5 mg Oral TID PRN Onuoha, Chinwendu V, NP       And   diphenhydrAMINE  (BENADRYL ) capsule 50 mg  50 mg Oral TID PRN Onuoha, Chinwendu V, NP       haloperidol  lactate (HALDOL ) injection 5 mg  5 mg Intramuscular TID PRN Onuoha, Chinwendu V, NP       And   diphenhydrAMINE  (BENADRYL ) injection 50 mg  50 mg Intramuscular TID PRN Onuoha, Chinwendu V, NP       And   LORazepam  (ATIVAN ) injection 2 mg  2 mg Intramuscular TID PRN Onuoha, Chinwendu V, NP       haloperidol  lactate (HALDOL ) injection 10 mg  10 mg Intramuscular TID PRN Onuoha, Chinwendu V, NP       And   diphenhydrAMINE  (BENADRYL ) injection 50 mg  50 mg Intramuscular TID PRN Onuoha, Chinwendu V, NP       And   LORazepam  (ATIVAN ) injection 2 mg  2 mg Intramuscular TID PRN Onuoha, Chinwendu V, NP       magnesium  hydroxide (MILK OF MAGNESIA) suspension 30 mL  30 mL Oral Daily PRN Onuoha, Chinwendu V, NP       traZODone  (DESYREL ) tablet 50 mg  50 mg Oral QHS PRN Onuoha, Chinwendu V, NP       PTA Medications: Medications Prior to Admission  Medication Sig Dispense Refill Last Dose/Taking   ARIPiprazole  (ABILIFY ) 15 MG tablet Take 1 tablet (15 mg total) by mouth at bedtime. 30 tablet 0    hydrOXYzine  (ATARAX ) 50 MG tablet Take 1 tablet (50 mg total) by mouth at bedtime. (Patient not taking: Reported  on 05/20/2024) 30 tablet 0    melatonin 5 MG TABS Take 5 mg by mouth at bedtime.      Oxcarbazepine  (TRILEPTAL ) 300 MG tablet Take 1 tablet (300 mg total) by mouth 2 (two) times daily. 60 tablet 0     Patient Stressors:    Patient Strengths:    Treatment Modalities: Medication Management, Group therapy, Case management,  1 to 1 session with clinician, Psychoeducation, Recreational therapy.   Physician Treatment Plan for Primary Diagnosis: Bipolar 1 disorder (HCC) Long Term Goal(s):     Short Term Goals:    Medication Management: Evaluate patient's response, side effects, and tolerance of medication regimen.  Therapeutic Interventions: 1 to 1 sessions, Unit Group sessions and Medication administration.  Evaluation of Outcomes: Not Met  Physician Treatment Plan for Secondary Diagnosis: Principal Problem:   Bipolar 1 disorder (HCC)  Long Term Goal(s):     Short Term Goals:       Medication Management: Evaluate patient's response, side effects, and tolerance of medication regimen.  Therapeutic Interventions: 1 to 1 sessions, Unit Group sessions and Medication administration.  Evaluation of Outcomes: Not Met   RN Treatment Plan for Primary Diagnosis: Bipolar 1 disorder (HCC) Long  Term Goal(s): Knowledge of disease and therapeutic regimen to maintain health will improve  Short Term Goals: Ability to verbalize frustration and anger appropriately will improve, Ability to demonstrate self-control, Ability to participate in decision making will improve, Ability to verbalize feelings will improve, Ability to disclose and discuss suicidal ideas, and Ability to identify and develop effective coping behaviors will improve  Medication Management: RN will administer medications as ordered by provider, will assess and evaluate patient's response and provide education to patient for prescribed medication. RN will report any adverse and/or side effects to prescribing provider.  Therapeutic  Interventions: 1 on 1 counseling sessions, Psychoeducation, Medication administration, Evaluate responses to treatment, Monitor vital signs and CBGs as ordered, Perform/monitor CIWA, COWS, AIMS and Fall Risk screenings as ordered, Perform wound care treatments as ordered.  Evaluation of Outcomes: Not Met   LCSW Treatment Plan for Primary Diagnosis: Bipolar 1 disorder (HCC) Long Term Goal(s): Safe transition to appropriate next level of care at discharge, Engage patient in therapeutic group addressing interpersonal concerns.  Short Term Goals: Engage patient in aftercare planning with referrals and resources, Increase social support, Increase ability to appropriately verbalize feelings, Increase emotional regulation, Facilitate acceptance of mental health diagnosis and concerns, Facilitate patient progression through stages of change regarding substance use diagnoses and concerns, Identify triggers associated with mental health/substance abuse issues, and Increase skills for wellness and recovery  Therapeutic Interventions: Assess for all discharge needs, 1 to 1 time with Social worker, Explore available resources and support systems, Assess for adequacy in community support network, Educate family and significant other(s) on suicide prevention, Complete Psychosocial Assessment, Interpersonal group therapy.  Evaluation of Outcomes: Not Met   Progress in Treatment: Attending groups: No. Participating in groups: No. Taking medication as prescribed: Yes. Toleration medication: Yes. Family/Significant other contact made: No, will contact:  CSW to contact once permission is granted.  Patient understands diagnosis: Yes. Discussing patient identified problems/goals with staff: Yes. Medical problems stabilized or resolved: Yes. Denies suicidal/homicidal ideation: Yes. Issues/concerns per patient self-inventory: No. Other: None  New problem(s) identified: No, Describe:  None  New Short Term/Long  Term Goal(s):detox, elimination of symptoms of psychosis, medication management for mood stabilization; elimination of SI thoughts; development of comprehensive mental wellness/sobriety plan.    Patient Goals:  I just want to get outpatient therapy.  Discharge Plan or Barriers: CSW to assist with the development of appropriate discharge plan.    Reason for Continuation of Hospitalization: Anxiety Delusions  Depression Suicidal ideation  Estimated Length of Stay: 1-7 days.   Last 3 [place] Suicide Severity Risk Score: Flowsheet Row Admission (Current) from 05/20/2024 in [facility] ED from 05/19/2024 in [facility] Emergency Department at [facility] ED from 08/28/2022 in [facility] Emergency Department at [facility]  C-SSRS RISK CATEGORY High Risk No Risk No Risk    Last PHQ 2/9 Scores:     No data to display          Scribe for Treatment Team: Alveta CHRISTELLA Kerns, LCSW 05/21/2024 10:53 AM

## 2024-05-21 NOTE — Progress Notes (Signed)
 Patient calm and pleasant during assessment. Pt observed interacting appropriately with staff and peers on the unit. Pt compliant with medication administration per MD orders. Pt given education, support, and encouragement to be active in her treatment plan. Pt being monitored Q 15 minutes for safety per unit protocol, remains safe on the unit

## 2024-05-21 NOTE — Progress Notes (Signed)
 Nutrition Brief Note  Patient identified on the Malnutrition Screening Tool (MST) Report  Wt Readings from Last 15 Encounters:  05/20/24 104.3 kg  05/20/24 93 kg  01/04/22 (!) 113.4 kg (>99%, Z= 2.45)*  10/18/21 (!) 107 kg (>99%, Z= 2.35)*  10/17/21 (!) 108.2 kg (>99%, Z= 2.37)*  07/31/21 (!) 97 kg (98%, Z= 2.16)*  07/30/21 86.2 kg (97%, Z= 1.87)*  05/30/21 87.5 kg (97%, Z= 1.92)*  06/26/20 (!) 90.7 kg (98%, Z= 2.06)*  06/01/20 (!) 109 kg (>99%, Z= 2.47)*  11/26/19 93.4 kg (99%, Z= 2.19)*  08/16/19 87.9 kg (98%, Z= 2.06)*  06/07/18 90 kg (99%, Z= 2.30)*  06/02/18 92.5 kg (>99%, Z= 2.37)*  10/25/17 78.9 kg (98%, Z= 2.04)*   * Growth percentiles are based on CDC (Girls, 2-20 Years) data.    Body mass index is 36.02 kg/m. Patient meets criteria for obesity, class II based on current BMI. Obesity is a complex, chronic medical condition that is optimally managed by a multidisciplinary care team. Weight loss is not an ideal goal for an acute inpatient hospitalization. However, if further work-up for obesity is warranted, consider outpatient referral to [facility].    Current diet order is regular, patient is consuming approximately n/a% of meals at this time. Labs and medications reviewed.   No nutrition interventions warranted at this time. If nutrition issues arise, please consult RD.   Margery ORN, RD, LDN, CDCES Registered Dietitian III Certified Diabetes Care and Education Specialist If unable to reach this RD, please use RD Inpatient group chat on secure chat between hours of 8am-4 pm daily

## 2024-05-21 NOTE — Group Note (Signed)
 Date:  05/21/2024 Time:  3:23 AM  Group Topic/Focus:  Goals Group:   The focus of this group is to help patients establish daily goals to achieve during treatment and discuss how the patient can incorporate goal setting into their daily lives to aide in recovery. Rediscovering Joy:   The focus of this group is to explore various ways to relieve stress in a positive manner. Recovery Goals:   The focus of this group is to identify appropriate goals for recovery and establish a plan to achieve them. Wrap-Up Group:   The focus of this group is to help patients review their daily goal of treatment and discuss progress on daily workbooks.    Participation Level:  Did Not Attend Additional Comments:    Kristen VEAR Gibbon 05/21/2024, 3:23 AM
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
## 2024-05-21 NOTE — Plan of Care (Signed)
  Problem: Coping: Goal: Ability to verbalize frustrations and anger appropriately will improve Outcome: Progressing   Problem: Coping: Goal: Ability to demonstrate self-control will improve Outcome: Progressing   Problem: Health Behavior/Discharge Planning: Goal: Compliance with treatment plan for underlying cause of condition will improve Outcome: Progressing   Problem: Safety: Goal: Periods of time without injury will increase Outcome: Progressing   Problem: Education: Goal: Emotional status will improve Outcome: Not Progressing Goal: Mental status will improve Outcome: Not Progressing   Problem: Coping: Goal: Coping ability will improve Outcome: Not Progressing   Problem: Health Behavior/Discharge Planning: Goal: Identification of resources available to assist in meeting health care needs will improve Outcome: Not Progressing   Janice Dixon is not yet progressing in these areas. During this shift, Janice Dixon remains much the same state as upon arrival.

## 2024-05-21 NOTE — Group Note (Signed)
 Recreation Therapy Group Note   Group Topic:Leisure Education  Group Date: 05/21/2024 Start Time: 1500 End Time: 1620 Facilitators: Celestia Jeoffrey FORBES ARTICE, CTRS Location: Craft Room  Group Description: Leisure. Patients were given the option to choose from singing karaoke, coloring mandalas, using oil pastels, journaling, painting or playing with play-doh. LRT and pts discussed the meaning of leisure, the importance of participating in leisure during their free time/when they're outside of the hospital, as well as how our leisure interests can also serve as coping skills.    Goal Area(s) Addressed:    Patient will identify a current leisure interest.    Patient will learn the definition of "leisure".   Patient will practice making a positive decision.   Patient will have the opportunity to try a new leisure activity.   Patient will communicate with peers and LRT.     Affect/Mood: Appropriate   Participation Level: Active and Engaged   Participation Quality: Independent   Behavior: Appropriate, Calm, and Cooperative   Speech/Thought Process: Coherent   Insight: Good   Judgement: Good   Modes of Intervention: Art and Exploration   Patient Response to Interventions:  Attentive, Engaged, Interested , and Receptive   Education Outcome:  Acknowledges education   Clinical Observations/Individualized Feedback: Corneisha was active in their participation of session activities and group discussion. Pt identified play sports and walk as things she does in her free time. Pt chose to paint while in group.   Plan: Continue to engage patient in RT group sessions 2-3x/week.   Jeoffrey FORBES Celestia, LRT, CTRS 05/21/2024 4:31 PM
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
## 2024-05-21 NOTE — Progress Notes (Signed)
   05/21/24 1300  Psych Admission Type (Psych Patients Only)  Admission Status Involuntary  Psychosocial Assessment  Patient Complaints Anxiety;Restlessness  Eye Contact Fair  Facial Expression Animated  Affect Apprehensive;Appropriate to circumstance  Speech Soft  Interaction Assertive  Motor Activity Slow  Appearance/Hygiene Unremarkable  Behavior Characteristics Cooperative;Pacing  Mood Anxious  Aggressive Behavior  Effect No apparent injury  Thought Process  Coherency WDL  Content WDL  Delusions None reported or observed  Perception WDL  Hallucination None reported or observed  Judgment Poor  Confusion None  Danger to Self  Current suicidal ideation? Denies  Danger to Others  Danger to Others None reported or observed   Janice Dixon spent much of the shift pacing. States that this was due to anxiety regarding discharge, but was told by her treatment team that this would not be happening today. Afterwards, anxiety appeared to lessen. Cooperative with assessments and medications, and states that her goal is to leave as soon to get back home to my baby mama.

## 2024-05-22 DIAGNOSIS — F319 Bipolar disorder, unspecified: Secondary | ICD-10-CM | POA: Diagnosis not present

## 2024-05-22 LAB — HEMOGLOBIN A1C
Hgb A1c MFr Bld: 4.9 % (ref 4.8–5.6)
Mean Plasma Glucose: 94 mg/dL

## 2024-05-22 MED ORDER — NICOTINE 14 MG/24HR TD PT24
14.0000 mg | MEDICATED_PATCH | Freq: Every day | TRANSDERMAL | Status: DC
  Administered 2024-05-22 – 2024-05-25 (×4): 14 mg via TRANSDERMAL
  Filled 2024-05-22 (×4): qty 1

## 2024-05-22 MED ORDER — VITAMIN D 25 MCG (1000 UNIT) PO TABS
1000.0000 [IU] | ORAL_TABLET | Freq: Every day | ORAL | Status: DC
Start: 2024-05-22 — End: 2024-05-25
  Administered 2024-05-22 – 2024-05-25 (×4): 1000 [IU] via ORAL
  Filled 2024-05-22 (×4): qty 1

## 2024-05-22 NOTE — Progress Notes (Signed)
 [facility] MD Progress Note  05/22/2024 3:50 PM Janice Dixon  MRN:  [id number]   Subjective:  Chart reviewed, case discussed in multidisciplinary meeting, patient seen during rounds.  Patient seen for follow-up today.  They are alert and oriented.  They are pleasant and cooperative on exam.  They are medication compliant.  They report they are tolerating medications without adverse effects.  They deny SI, HI, and AVH.  They continued to demonstrate poor insight into the severity of their overdose.  He continued to require inpatient psychiatric hospitalization due to risk of self injury.  They are encouraged to continue to participate in groups in the unit milieu.  They utilize as needed trazodone  and acetaminophen  overnight.  They voiced no concerns or complaints at this time.  Continue current medication regimen Abilify  10 mg daily and Trileptal  150 mg twice daily.  Hemoglobin A1c was 4.9, vitamin D  was borderline 24.57.  TSH was in the normal range.  Lipid panel indicated low HDL.  Sleep: Fair  Appetite:  Fair  Past Psychiatric History: see h&P Family History: History reviewed. No pertinent family history. Social History:  Social History   Substance and Sexual Activity  Alcohol Use Never     Social History   Substance and Sexual Activity  Drug Use Yes   Types: Marijuana    Social History   Socioeconomic History   Marital status: Single    Spouse name: Not on file   Number of children: Not on file   Years of education: Not on file   Highest education level: Not on file  Occupational History   Not on file  Tobacco Use   Smoking status: Never   Smokeless tobacco: Never  Vaping Use   Vaping status: Every Day   Substances: Nicotine , Flavoring  Substance and Sexual Activity   Alcohol use: Never   Drug use: Yes    Types: Marijuana   Sexual activity: Not Currently    Birth control/protection: Abstinence  Other Topics Concern   Not on file  Social History Narrative   Not on  file   Social Drivers of Health   Financial Resource Strain: Not on file  Food Insecurity: No Food Insecurity (05/20/2024)   Hunger Vital Sign    Worried About Running Out of Food in the Last Year: Never true    Ran Out of Food in the Last Year: Never true  Transportation Needs: No Transportation Needs (05/20/2024)   PRAPARE - Administrator, Civil Service (Medical): No    Lack of Transportation (Non-Medical): No  Physical Activity: Not on file  Stress: Not on file  Social Connections: Not on file   Past Medical History:  Past Medical History:  Diagnosis Date   ADHD (attention deficit hyperactivity disorder)    Anxiety    Bipolar 1 disorder (HCC)    Depression    Headache    History reviewed. No pertinent surgical history.  Current Medications: Current Facility-Administered Medications  Medication Dose Route Frequency Provider Last Rate Last Admin   acetaminophen  (TYLENOL ) tablet 650 mg  650 mg Oral Q6H PRN Onuoha, Chinwendu V, NP   650 mg at 05/22/24 0857   alum & mag hydroxide-simeth (MAALOX/MYLANTA) 200-200-20 MG/5ML suspension 30 mL  30 mL Oral Q4H PRN Onuoha, Chinwendu V, NP       ARIPiprazole  (ABILIFY ) tablet 10 mg  10 mg Oral Daily Adyan Palau E, PA-C   10 mg at 05/22/24 9191   haloperidol  (HALDOL ) tablet 5 mg  5 mg Oral TID PRN Onuoha, Chinwendu V, NP       And   diphenhydrAMINE  (BENADRYL ) capsule 50 mg  50 mg Oral TID PRN Onuoha, Chinwendu V, NP       haloperidol  lactate (HALDOL ) injection 5 mg  5 mg Intramuscular TID PRN Onuoha, Chinwendu V, NP       And   diphenhydrAMINE  (BENADRYL ) injection 50 mg  50 mg Intramuscular TID PRN Onuoha, Chinwendu V, NP       And   LORazepam  (ATIVAN ) injection 2 mg  2 mg Intramuscular TID PRN Onuoha, Chinwendu V, NP       haloperidol  lactate (HALDOL ) injection 10 mg  10 mg Intramuscular TID PRN Onuoha, Chinwendu V, NP       And   diphenhydrAMINE  (BENADRYL ) injection 50 mg  50 mg Intramuscular TID PRN Onuoha,  Chinwendu V, NP       And   LORazepam  (ATIVAN ) injection 2 mg  2 mg Intramuscular TID PRN Onuoha, Chinwendu V, NP       magnesium  hydroxide (MILK OF MAGNESIA) suspension 30 mL  30 mL Oral Daily PRN Onuoha, Chinwendu V, NP       nicotine  (NICODERM CQ  - dosed in mg/24 hours) patch 14 mg  14 mg Transdermal Daily Nana Vastine E, PA-C   14 mg at 05/22/24 1233   OXcarbazepine  (TRILEPTAL ) tablet 150 mg  150 mg Oral BID Keili Hasten E, PA-C   150 mg at 05/22/24 9191   traZODone  (DESYREL ) tablet 50 mg  50 mg Oral QHS PRN Onuoha, Chinwendu V, NP   50 mg at 05/21/24 2123    Lab Results: No results found for this or any previous visit (from the past 48 hours).  Blood Alcohol level:  Lab Results  Component Value Date   [facility] <15 05/20/2024   ETH <10 01/04/2022    Metabolic Disorder Labs: Lab Results  Component Value Date   HGBA1C 4.9 05/20/2024   MPG 94 05/20/2024   MPG 96.8 10/19/2021   Lab Results  Component Value Date   PROLACTIN 18.4 08/01/2021   Lab Results  Component Value Date   CHOL 136 05/20/2024   TRIG 97 05/20/2024   HDL 32 (L) 05/20/2024   CHOLHDL 4.3 05/20/2024   VLDL 19 05/20/2024   LDLCALC 85 05/20/2024   LDLCALC 104 (H) 10/19/2021    Physical Findings: AIMS:  , ,  ,  ,    CIWA:    COWS:      Psychiatric Specialty Exam:  Presentation  General Appearance:  Casual  Eye Contact: Fleeting  Speech: Clear and Coherent  Speech Volume: Normal    Mood and Affect  Mood: Euthymic  Affect: Flat   Thought Process  Thought Processes: Coherent  Descriptions of Associations:Intact  Orientation:Full (Time, Place and Person)  Thought Content:WDL  Hallucinations:Hallucinations: None  Ideas of Reference:None  Suicidal Thoughts:Suicidal Thoughts: No  Homicidal Thoughts:Homicidal Thoughts: No   Sensorium  Memory: Immediate Fair  Judgment: Poor  Insight: Poor   Executive Functions  Concentration: Fair  Attention Span:No  data recorded Recall:No data recorded Fund of Knowledge:No data recorded Language:No data recorded  Psychomotor Activity  Psychomotor Activity: Psychomotor Activity: Normal  Musculoskeletal: Strength & Muscle Tone: within normal limits Gait & Station: normal Assets  Assets: Housing; Social Support    Physical Exam: Physical Exam Vitals and nursing note reviewed.  Constitutional:      Appearance: She is obese.  HENT:     Head: Atraumatic.  Eyes:  Extraocular Movements: Extraocular movements intact.  Pulmonary:     Effort: Pulmonary effort is normal.  Neurological:     Mental Status: She is alert and oriented to person, place, and time.    Review of Systems  Psychiatric/Behavioral:  Negative for hallucinations, substance abuse and suicidal ideas. The patient does not have insomnia.    Blood pressure 124/76, pulse 68, temperature (!) 97.4 F (36.3 C), resp. rate 17, height 5' 7 (1.702 m), weight 104.3 kg, SpO2 100%. Body mass index is 36.02 kg/m.  Diagnosis: Principal Problem:   Bipolar 1 disorder (HCC)   PLAN: Safety and Monitoring:  -- Voluntary admission to inpatient psychiatric unit for safety, stabilization and treatment  -- Daily contact with patient to assess and evaluate symptoms and progress in treatment  -- Patient's case to be discussed in multi-disciplinary team meeting  -- Observation Level : q15 minute checks  -- Vital signs:  q12 hours  -- Precautions: suicide, elopement, and assault -- Encouraged patient to participate in unit milieu and in scheduled group therapies  2. Psychiatric Diagnoses and Treatment:     Bipolar I Disorder             --  Continue abilify  10 mg daily             -- Continue trileptal  150 mg bid     -- The risks/benefits/side-effects/alternatives to this medication were discussed in detail with the patient and time was given for questions. The patient consents to medication trial.                -- Metabolic profile  and EKG monitoring obtained while on an atypical antipsychotic (BMI: Lipid Panel: HbgA1c: [id number])              -- Encouraged patient to participate in unit milieu and in scheduled group therapies                            3. Medical Issues Being Addressed:   Start Vit d3 25 mcg daily  4. Discharge Planning:   -- Social work and case management to assist with discharge planning and identification of hospital follow-up needs prior to discharge  -- Estimated LOS: 3-4 days  Donnice FORBES Right, PA-C 05/22/2024, 3:50 PM

## 2024-05-22 NOTE — Group Note (Deleted)
 Date:  05/22/2024 Time:  3:14 PM  Group Topic/Focus:  Emotional Education:   The focus of this group is to discuss what feelings/emotions are, and how they are experienced.     Participation Level:  {BHH PARTICIPATION OZCZO:77735}  Participation Quality:  {BHH PARTICIPATION QUALITY:22265}  Affect:  {BHH AFFECT:22266}  Cognitive:  {BHH COGNITIVE:22267}  Insight: {BHH Insight2:20797}  Engagement in Group:  {BHH ENGAGEMENT IN HMNLE:77731}  Modes of Intervention:  {BHH MODES OF INTERVENTION:22269}  Additional Comments:  ***  Janice Dixon 05/22/2024, 3:14 PM

## 2024-05-22 NOTE — Plan of Care (Signed)
   Problem: Education: Goal: Emotional status will improve Outcome: Progressing Goal: Mental status will improve Outcome: Progressing

## 2024-05-22 NOTE — BHH Suicide Risk Assessment (Signed)
 [facility] INPATIENT:  Family/Significant Other Suicide Prevention Education  Suicide Prevention Education:  Contact Attempts: Leotis Hugger, [phone], Mother , has been identified by the patient as the family member/significant other with whom the patient will be residing, and identified as the person(s) who will aid the patient in the event of a mental health crisis.  With written consent from the patient, two attempts were made to provide suicide prevention education, prior to and/or following the patient's discharge.  We were unsuccessful in providing suicide prevention education.  A suicide education pamphlet was given to the patient to share with family/significant other.  Date and time of first attempt: 05/22/24 at 3:54 pm Date and time of second attempt: attempt needed  Janice Dixon 05/22/2024, 3:53 PM

## 2024-05-22 NOTE — Group Note (Signed)
 Date:  05/22/2024 Time:  3:20 PM  Group Topic/Focus:  Emotional Education:   The focus of this group is to discuss what feelings/emotions are, and how they are experienced.    Participation Level:  Active  Participation Quality:  Appropriate  Affect:  Appropriate  Cognitive:  Appropriate  Insight: Appropriate  Engagement in Group:  Engaged  Modes of Intervention:  Activity  Additional Comments:    Camellia HERO Shanequia Kendrick 05/22/2024, 3:20 PM

## 2024-05-22 NOTE — Progress Notes (Signed)
   05/22/24 0857  Psych Admission Type (Psych Patients Only)  Admission Status Involuntary  Psychosocial Assessment  Patient Complaints Anxiety  Eye Contact Fair  Facial Expression Flat  Affect Anxious  Speech Logical/coherent  Interaction Assertive  Motor Activity Other (Comment) (WDL)  Appearance/Hygiene Unremarkable  Behavior Characteristics Cooperative;Anxious  Mood Anxious  Thought Process  Coherency WDL  Content WDL  Delusions None reported or observed  Perception WDL  Hallucination None reported or observed  Judgment Poor  Confusion None  Danger to Self  Current suicidal ideation? Denies  Danger to Others  Danger to Others None reported or observed

## 2024-05-22 NOTE — Plan of Care (Signed)
   Problem: Education: Goal: Emotional status will improve Outcome: Progressing Goal: Mental status will improve Outcome: Progressing Goal: Verbalization of understanding the information provided will improve Outcome: Progressing

## 2024-05-23 DIAGNOSIS — F319 Bipolar disorder, unspecified: Secondary | ICD-10-CM | POA: Diagnosis not present

## 2024-05-23 MED ORDER — NICOTINE POLACRILEX 2 MG MT GUM
2.0000 mg | CHEWING_GUM | OROMUCOSAL | Status: DC | PRN
  Administered 2024-05-23: 2 mg via ORAL
  Filled 2024-05-23: qty 1

## 2024-05-23 NOTE — [facility] Suicide Risk Assessment (Signed)
 [facility] INPATIENT:  Family/Significant Other Suicide Prevention Education  Suicide Prevention Education:  Education Completed; Janice Dixon, [phone], Mother ,  has been identified by the patient as the family member/significant other with whom the patient will be residing, and identified as the person(s) who will aid the patient in the event of a mental health crisis (suicidal ideations/suicide attempt).  With written consent from the patient, the family member/significant other has been provided the following suicide prevention education, prior to the and/or following the discharge of the patient.  The suicide prevention education provided includes the following: Suicide risk factors Suicide prevention and interventions National Suicide Hotline telephone number [facility] assessment telephone number [facility] Emergency Assistance 911 [facility] and/or Residential Mobile Crisis Unit telephone number  Request made of family/significant other to: Remove weapons (e.g., guns, rifles, knives), all items previously/currently identified as safety concern.   Remove drugs/medications (over-the-counter, prescriptions, illicit drugs), all items previously/currently identified as a safety concern.  The family member/significant other verbalizes understanding of the suicide prevention education information provided.  The family member/significant other agrees to remove the items of safety concern listed above.  The LCSWA contacted the patient mother to provide SPI. The patient mom inquired about the patient prescription. The LCSWA informed mom that the prescription would be sent the pharmacy that the patient usually uses. Mom provided the LCSWA with the pharmacy at [facility] in [place] on [address].  Mom requested that the patient be referred for mediation management because the patient informed her that the mediation is working and she wanted to make sure that she continues to take  them. Mom reported that either herself or the patient grandparents would pick her up at discharge. Mom reports that there are no guns or weapons in the home.   Janice Dixon Lento 05/23/2024, 10:23 AM

## 2024-05-23 NOTE — Group Note (Signed)
 Date:  05/23/2024 Time:  2:24 PM  Group Topic/Focus:  Identifying Needs:   The focus of this group is to help patients identify their personal needs that have been historically problematic and identify healthy behaviors to address their needs.    Participation Level:  Active  Participation Quality:  Appropriate  Affect:  Appropriate  Cognitive:  Appropriate  Insight: Appropriate  Engagement in Group:  Engaged  Modes of Intervention:  Activity  Additional Comments:    Camellia HERO Savion Washam 05/23/2024, 2:24 PM

## 2024-05-23 NOTE — Group Note (Signed)
 [facility] LCSW Group Therapy Note   Group Date: 05/23/2024 Start Time: 1400 End Time: 1445   Type of Therapy/Topic:  Group Therapy:  Emotion Regulation  Participation Level:  Did Not Attend   Mood:  Description of Group:    The purpose of this group is to assist patients in learning to regulate negative emotions and experience positive emotions. Patients will be guided to discuss ways in which they have been vulnerable to their negative emotions. These vulnerabilities will be juxtaposed with experiences of positive emotions or situations, and patients challenged to use positive emotions to combat negative ones. Special emphasis will be placed on coping with negative emotions in conflict situations, and patients will process healthy conflict resolution skills.  Therapeutic Goals: Patient will identify two positive emotions or experiences to reflect on in order to balance out negative emotions:  Patient will label two or more emotions that they find the most difficult to experience:  Patient will be able to demonstrate positive conflict resolution skills through discussion or role plays:   Summary of Patient Progress:  The patient did not attend group.      Therapeutic Modalities:   Cognitive Behavioral Therapy Feelings Identification Dialectical Behavioral Therapy   Roselyn GORMAN Lento, LCSW

## 2024-05-23 NOTE — Progress Notes (Signed)
 Pt pleasant on engagement.  She reports feeling good and informing that the medication is working.  Pt denied SI/HI, AVH, depression and anxiety.   05/22/24 2200  Psych Admission Type (Psych Patients Only)  Admission Status Involuntary  Psychosocial Assessment  Patient Complaints None  Eye Contact Fair  Facial Expression Animated  Affect Appropriate to circumstance  Speech Logical/coherent  Interaction Assertive  Motor Activity Other (Comment) (WDL)  Appearance/Hygiene Unremarkable  Behavior Characteristics Cooperative;Appropriate to situation  Mood Pleasant  Aggressive Behavior  Effect No apparent injury  Thought Process  Coherency WDL  Content WDL  Delusions None reported or observed  Perception WDL  Hallucination None reported or observed  Judgment Limited  Confusion None  Danger to Self  Current suicidal ideation? Denies  Agreement Not to Harm Self Yes  Description of Agreement Verbal  Danger to Others  Danger to Others None reported or observed    Problem: Education: Goal: Knowledge of [place] [facility] Education information/materials will improve Outcome: Progressing Goal: Emotional status will improve Outcome: Progressing Goal: Mental status will improve Outcome: Progressing Goal: Verbalization of understanding the information provided will improve Outcome: Progressing   Problem: Activity: Goal: Interest or engagement in activities will improve Outcome: Progressing Goal: Sleeping patterns will improve Outcome: Progressing   Problem: Coping: Goal: Ability to verbalize frustrations and anger appropriately will improve Outcome: Progressing Goal: Ability to demonstrate self-control will improve Outcome: Progressing

## 2024-05-23 NOTE — Plan of Care (Signed)
  Problem: Education: Goal: Emotional status will improve Outcome: Progressing Goal: Mental status will improve Outcome: Progressing Goal: Verbalization of understanding the information provided will improve Outcome: Progressing   Problem: Activity: Goal: Interest or engagement in activities will improve Outcome: Progressing   Problem: Coping: Goal: Ability to verbalize frustrations and anger appropriately will improve Outcome: Progressing Goal: Ability to demonstrate self-control will improve Outcome: Progressing   Problem: Physical Regulation: Goal: Ability to maintain clinical measurements within normal limits will improve Outcome: Progressing   Problem: Safety: Goal: Periods of time without injury will increase Outcome: Progressing

## 2024-05-23 NOTE — Progress Notes (Signed)
 [facility] MD Progress Note  05/23/2024 3:38 PM Janice Dixon  MRN:  [id number]   Subjective:  Chart reviewed, case discussed in multidisciplinary meeting, patient seen during rounds.   8/3: Patient seen for follow-up today.  They are alert and oriented.  They are pleasant and cooperative on exam.  They are found engaging in the unit milieu and interacting appropriately with peers and staff.  They are regularly on the phones with their loved ones.  They deny SI, HI, and AVH.  Insight is improving and they are able to acknowledge today they should not of taking that overdose.  They are also able to acknowledge manipulative behavior previous self-harm's attempts.  They are medication compliant.  They did not require behavioral PRNs overnight.  They did utilize as needed trazodone  and note that sleep is stable.  They note stable mood and appetite.  They rate depression at 0 out of 10.  They rated anxiety at 0 out of 10.  They contract for safety.  They voiced no concerns or complaints at this time.  8/2: Patient seen for follow-up today.  They are alert and oriented.  They are pleasant and cooperative on exam.  They are medication compliant.  They report they are tolerating medications without adverse effects.  They deny SI, HI, and AVH.  They continued to demonstrate poor insight into the severity of their overdose.  He continued to require inpatient psychiatric hospitalization due to risk of self injury.  They are encouraged to continue to participate in groups in the unit milieu.  They utilize as needed trazodone  and acetaminophen  overnight.  They voiced no concerns or complaints at this time.  Continue current medication regimen Abilify  10 mg daily and Trileptal  150 mg twice daily.  Hemoglobin A1c was 4.9, vitamin D  was borderline 24.57.  TSH was in the normal range.  Lipid panel indicated low HDL.  Sleep: Fair  Appetite:  Fair  Past Psychiatric History: see h&P Family History: History reviewed. No  pertinent family history. Social History:  Social History   Substance and Sexual Activity  Alcohol Use Never     Social History   Substance and Sexual Activity  Drug Use Yes   Types: Marijuana    Social History   Socioeconomic History   Marital status: Single    Spouse name: Not on file   Number of children: Not on file   Years of education: Not on file   Highest education level: Not on file  Occupational History   Not on file  Tobacco Use   Smoking status: Never   Smokeless tobacco: Never  Vaping Use   Vaping status: Every Day   Substances: Nicotine , Flavoring  Substance and Sexual Activity   Alcohol use: Never   Drug use: Yes    Types: Marijuana   Sexual activity: Not Currently    Birth control/protection: Abstinence  Other Topics Concern   Not on file  Social History Narrative   Not on file   Social Drivers of Health   Financial Resource Strain: Not on file  Food Insecurity: No Food Insecurity (05/20/2024)   Hunger Vital Sign    Worried About Running Out of Food in the Last Year: Never true    Ran Out of Food in the Last Year: Never true  Transportation Needs: No Transportation Needs (05/20/2024)   PRAPARE - Administrator, Civil Service (Medical): No    Lack of Transportation (Non-Medical): No  Physical Activity: Not on file  Stress: Not on file  Social Connections: Not on file   Past Medical History:  Past Medical History:  Diagnosis Date   ADHD (attention deficit hyperactivity disorder)    Anxiety    Bipolar 1 disorder (HCC)    Depression    Headache    History reviewed. No pertinent surgical history.  Current Medications: Current Facility-Administered Medications  Medication Dose Route Frequency Provider Last Rate Last Admin   acetaminophen  (TYLENOL ) tablet 650 mg  650 mg Oral Q6H PRN Onuoha, Chinwendu V, NP   650 mg at 05/22/24 0857   alum & mag hydroxide-simeth (MAALOX/MYLANTA) 200-200-20 MG/5ML suspension 30 mL  30 mL Oral Q4H  PRN Onuoha, Chinwendu V, NP       ARIPiprazole  (ABILIFY ) tablet 10 mg  10 mg Oral Daily Chantry Headen E, PA-C   10 mg at 05/23/24 9262   cholecalciferol  (VITAMIN D3) 25 MCG (1000 UNIT) tablet 1,000 Units  1,000 Units Oral Daily Madilynne Mullan E, PA-C   1,000 Units at 05/23/24 9262   haloperidol  (HALDOL ) tablet 5 mg  5 mg Oral TID PRN Onuoha, Chinwendu V, NP       And   diphenhydrAMINE  (BENADRYL ) capsule 50 mg  50 mg Oral TID PRN Onuoha, Chinwendu V, NP       haloperidol  lactate (HALDOL ) injection 5 mg  5 mg Intramuscular TID PRN Onuoha, Chinwendu V, NP       And   diphenhydrAMINE  (BENADRYL ) injection 50 mg  50 mg Intramuscular TID PRN Onuoha, Chinwendu V, NP       And   LORazepam  (ATIVAN ) injection 2 mg  2 mg Intramuscular TID PRN Onuoha, Chinwendu V, NP       haloperidol  lactate (HALDOL ) injection 10 mg  10 mg Intramuscular TID PRN Onuoha, Chinwendu V, NP       And   diphenhydrAMINE  (BENADRYL ) injection 50 mg  50 mg Intramuscular TID PRN Onuoha, Chinwendu V, NP       And   LORazepam  (ATIVAN ) injection 2 mg  2 mg Intramuscular TID PRN Onuoha, Chinwendu V, NP       magnesium  hydroxide (MILK OF MAGNESIA) suspension 30 mL  30 mL Oral Daily PRN Onuoha, Chinwendu V, NP       nicotine  (NICODERM CQ  - dosed in mg/24 hours) patch 14 mg  14 mg Transdermal Daily Kathlene Yano E, PA-C   14 mg at 05/23/24 9263   OXcarbazepine  (TRILEPTAL ) tablet 150 mg  150 mg Oral BID Ewald Beg E, PA-C   150 mg at 05/23/24 9262   traZODone  (DESYREL ) tablet 50 mg  50 mg Oral QHS PRN Onuoha, Chinwendu V, NP   50 mg at 05/22/24 2111    Lab Results: No results found for this or any previous visit (from the past 48 hours).  Blood Alcohol level:  Lab Results  Component Value Date   [facility] <15 05/20/2024   ETH <10 01/04/2022    Metabolic Disorder Labs: Lab Results  Component Value Date   HGBA1C 4.9 05/20/2024   MPG 94 05/20/2024   MPG 96.8 10/19/2021   Lab Results  Component Value Date    PROLACTIN 18.4 08/01/2021   Lab Results  Component Value Date   CHOL 136 05/20/2024   TRIG 97 05/20/2024   HDL 32 (L) 05/20/2024   CHOLHDL 4.3 05/20/2024   VLDL 19 05/20/2024   LDLCALC 85 05/20/2024   LDLCALC 104 (H) 10/19/2021    Physical Findings: AIMS:  , ,  ,  ,  CIWA:    COWS:      Psychiatric Specialty Exam:  Presentation  General Appearance:  Casual  Eye Contact: Fleeting  Speech: Clear and Coherent  Speech Volume: Normal    Mood and Affect  Mood: Euthymic  Affect: Flat   Thought Process  Thought Processes: Coherent  Descriptions of Associations:Intact  Orientation:Full (Time, Place and Person)  Thought Content:WDL  Hallucinations:No data recorded  Ideas of Reference:None  Suicidal Thoughts:No data recorded  Homicidal Thoughts:No data recorded   Sensorium  Memory: Immediate Fair  Judgment: Poor  Insight: Poor   Executive Functions  Concentration: Fair  Attention Span:No data recorded Recall:No data recorded Fund of Knowledge:No data recorded Language:No data recorded  Psychomotor Activity  Psychomotor Activity: No data recorded  Musculoskeletal: Strength & Muscle Tone: within normal limits Gait & Station: normal Assets  Assets: Housing; Social Support    Physical Exam: Physical Exam Vitals and nursing note reviewed.  Constitutional:      Appearance: She is obese.  HENT:     Head: Atraumatic.  Eyes:     Extraocular Movements: Extraocular movements intact.  Pulmonary:     Effort: Pulmonary effort is normal.  Neurological:     Mental Status: She is alert and oriented to person, place, and time.    Review of Systems  Psychiatric/Behavioral:  Negative for hallucinations, substance abuse and suicidal ideas. The patient does not have insomnia.    Blood pressure 137/78, pulse 78, temperature (!) 97 F (36.1 C), resp. rate 18, height 5' 7 (1.702 m), weight 104.3 kg, SpO2 100%. Body mass index is 36.02  kg/m.  Diagnosis: Principal Problem:   Bipolar 1 disorder (HCC)   PLAN: Safety and Monitoring:  -- Voluntary admission to inpatient psychiatric unit for safety, stabilization and treatment  -- Daily contact with patient to assess and evaluate symptoms and progress in treatment  -- Patient's case to be discussed in multi-disciplinary team meeting  -- Observation Level : q15 minute checks  -- Vital signs:  q12 hours  -- Precautions: suicide, elopement, and assault -- Encouraged patient to participate in unit milieu and in scheduled group therapies  2. Psychiatric Diagnoses and Treatment:  They are tolerating medications without adverse effect.  They are linear on exam.  They are denying SI, HI, and AVH.  They demonstrate more insight into the severity of the overdose.  We will continue to monitor and work toward safe discharge disposition.  Patient continues to require inpatient psychiatric hospitalization for medication management and stabilization due to risk of self injury.     Bipolar I Disorder             --  Continue abilify  10 mg daily             -- Continue trileptal  150 mg bid     -- The risks/benefits/side-effects/alternatives to this medication were discussed in detail with the patient and time was given for questions. The patient consents to medication trial.                -- Metabolic profile and EKG monitoring obtained while on an atypical antipsychotic (BMI: Lipid Panel: HbgA1c: [id number])              -- Encouraged patient to participate in unit milieu and in scheduled group therapies                            3. Medical Issues Being Addressed:  Continue Vit d3 25 mcg daily  4. Discharge Planning:   -- Social work and case management to assist with discharge planning and identification of hospital follow-up needs prior to discharge  -- Estimated LOS: 3-4 days  Janice FORBES Right, PA-C 05/23/2024, 3:38 PM

## 2024-05-23 NOTE — Progress Notes (Signed)
   05/23/24 1000  Psych Admission Type (Psych Patients Only)  Admission Status Involuntary  Psychosocial Assessment  Patient Complaints None  Eye Contact Fair  Facial Expression Animated  Affect Appropriate to circumstance  Speech Logical/coherent  Interaction Assertive  Motor Activity Other (Comment) (WNL)  Appearance/Hygiene Unremarkable  Behavior Characteristics Cooperative;Appropriate to situation  Mood Pleasant  Aggressive Behavior  Effect No apparent injury  Thought Process  Coherency WDL  Content WDL  Delusions None reported or observed  Perception WDL  Hallucination None reported or observed  Judgment Impaired  Confusion None  Danger to Self  Current suicidal ideation? Denies  Agreement Not to Harm Self Yes  Description of Agreement verbal  Danger to Others  Danger to Others None reported or observed

## 2024-05-23 NOTE — Group Note (Signed)
 Date:  05/23/2024 Time:  6:39 PM  Group Topic/Focus:  Activity Group: The focus of the group is to promote activity for the patients and to encourage them to go outside to the courtyard and get some fresh air and exercise.    Participation Level:  Active  Participation Quality:  Appropriate  Affect:  Appropriate  Cognitive:  Appropriate  Insight: Appropriate  Engagement in Group:  Engaged  Modes of Intervention:  Activity  Additional Comments:    Janice Dixon 05/23/2024, 6:39 PM

## 2024-05-23 NOTE — Group Note (Signed)
 Date:  05/23/2024 Time:  9:48 PM  Group Topic/Focus:  Wrap-Up Group:   The focus of this group is to help patients review their daily goal of treatment and discuss progress on daily workbooks.    Participation Level:  Active  Participation Quality:  Appropriate, Attentive, Sharing, and Supportive  Affect:  Appropriate  Cognitive:  Appropriate  Insight: Good  Engagement in Group:  Limited  Modes of Intervention:  Discussion  Additional Comments:     Kerri Katz 05/23/2024, 9:48 PM

## 2024-05-24 NOTE — Group Note (Signed)
 Date:  05/24/2024 Time:  8:23 PM  Group Topic/Focus:  Wrap-Up Group:   The focus of this group is to help patients review their daily goal of treatment and discuss progress on daily workbooks.    Participation Level:  Active  Participation Quality:  Appropriate and Attentive  Affect:  Appropriate  Cognitive:  Alert and Appropriate  Insight: Appropriate, Good, and Improving  Engagement in Group:  Improving  Modes of Intervention:  Clarification, Discussion, Rapport Building, and Support  Additional Comments:     Nox Talent 05/24/2024, 8:23 PM

## 2024-05-24 NOTE — Progress Notes (Signed)
 Pt pleasant on engagement and continues to reports elevation of mood.  She denied depression, anxiety, SIB/SI/HI and AVH.  Pt states "I want to be stable on the outside, I will continue to take my medication and see my Therapist."  Also informed that the medications are working.  Pt was requesting additional sleep aid adding that the doctor was to increase the night scheduled Trazodone .   05/23/24 2200  Psych Admission Type (Psych Patients Only)  Admission Status Involuntary  Psychosocial Assessment  Patient Complaints None  Eye Contact Fair  Facial Expression Animated  Affect Appropriate to circumstance  Speech Logical/coherent  Interaction Assertive  Motor Activity Other (Comment) (WDL)  Appearance/Hygiene Unremarkable  Behavior Characteristics Appropriate to situation;Cooperative  Mood Pleasant  Aggressive Behavior  Effect No apparent injury  Thought Process  Coherency WDL  Content WDL  Delusions None reported or observed  Perception WDL  Hallucination None reported or observed  Judgment Limited  Confusion None  Danger to Self  Current suicidal ideation? Denies  Agreement Not to Harm Self Yes  Description of Agreement Verbal  Danger to Others  Danger to Others None reported or observed    Problem: Education: Goal: Knowledge of [place] [facility] Education information/materials will improve Outcome: Progressing Goal: Emotional status will improve Outcome: Progressing Goal: Mental status will improve Outcome: Progressing Goal: Verbalization of understanding the information provided will improve Outcome: Progressing   Problem: Activity: Goal: Interest or engagement in activities will improve Outcome: Progressing Goal: Sleeping patterns will improve Outcome: Progressing   Problem: Coping: Goal: Ability to verbalize frustrations and anger appropriately will improve Outcome: Progressing Goal: Ability to demonstrate self-control will improve Outcome: Progressing

## 2024-05-24 NOTE — Group Note (Signed)
 Recreation Therapy Group Note   Group Topic:General Recreation  Group Date: 05/24/2024 Start Time: 1035 End Time: 1145 Facilitators: Celestia Jeoffrey BRAVO, LRT, CTRS Location: Courtyard  Group Description: [employer]. LRT and patients played games of basketball, drew with chalk, and played corn hole while outside in the courtyard while getting fresh air and sunlight. Music was being played in the background. LRT and peers conversed about different games they have played before, what they do in their free time and anything else that is on their minds. LRT encouraged pts to drink water after being outside, sweating and getting their heart rate up.  Goal Area(s) Addressed: Patient will build on frustration tolerance skills. Patients will partake in a competitive play game with peers. Patients will gain knowledge of new leisure interest/hobby.    Affect/Mood: Appropriate   Participation Level: Active and Engaged   Participation Quality: Independent   Behavior: Appropriate, Calm, and Cooperative   Speech/Thought Process: Coherent   Insight: Good   Judgement: Good   Modes of Intervention: Activity, Exploration, Music, and Rapport Building   Patient Response to Interventions:  Attentive, Engaged, Interested , and Receptive   Education Outcome:  Acknowledges education   Clinical Observations/Individualized Feedback: Sharan was active in their participation of session activities and group discussion. Pt chose to play basketball and talk with LRT and peers while in group. Pt interacted well with LRT and peers duration of session.    Plan: Continue to engage patient in RT group sessions 2-3x/week.   Jeoffrey BRAVO Celestia, LRT, CTRS 05/24/2024 1:41 PM

## 2024-05-24 NOTE — Progress Notes (Signed)
 [facility] MD Progress Note  05/24/2024 1:46 PM Janice Dixon  MRN:  [id number]   Subjective:  Chart reviewed, case discussed in multidisciplinary meeting, patient seen during rounds.   Patient seen today for follow-up psychiatric evaluation. She is pleasant and cooperative throughout the interview. When asked about her mood, she states it is "great." We reviewed her medication regimen and the events leading to this admission. She explains that she had taken an overdose of approximately twenty 12-milligram melatonin tablets, describing the act as "attempting seeking." She reports her last suicide attempt was also via overdose approximately three years ago. She states Abilify  and Trileptal  are "helping" her mood. She denies suicidal ideation (SI), homicidal ideation (HI), hallucinations, paranoia, and delusions. She reports good medication adherence and denies any side effects.   She denies active suicidal or homicidal ideation and reports improvement with current medications. No psychotic symptoms are present. She demonstrates future planning and insight into her recent overdose behavior. Mood appears stabilized on current medication regimen.  Patient continues to require this acute inpatient psychiatric setting for safety, medication management, and symptom stabilization to address need for continued treatment however will begin discharge planning. Patient will continue current medications, including Abilify  and Trileptal , given reported benefit and tolerability. Supportive therapy will continue to address coping strategies and safety planning. Discharge planning includes return to living with grandmother, who is described as a strong support. No concerns noted at this time requiring adjustment to care plan.  Hemoglobin A1c was 4.9, vitamin D  was borderline 24.57.  TSH was in the normal range.  Lipid panel indicated low HDL.  Sleep: Fair  Appetite:  Fair improving  Past Psychiatric History: see  h&P Family History: History reviewed. No pertinent family history. Social History:  Social History   Substance and Sexual Activity  Alcohol Use Never     Social History   Substance and Sexual Activity  Drug Use Yes   Types: Marijuana    Social History   Socioeconomic History   Marital status: Single    Spouse name: Not on file   Number of children: Not on file   Years of education: Not on file   Highest education level: Not on file  Occupational History   Not on file  Tobacco Use   Smoking status: Never   Smokeless tobacco: Never  Vaping Use   Vaping status: Every Day   Substances: Nicotine , Flavoring  Substance and Sexual Activity   Alcohol use: Never   Drug use: Yes    Types: Marijuana   Sexual activity: Not Currently    Birth control/protection: Abstinence  Other Topics Concern   Not on file  Social History Narrative   Not on file   Social Drivers of Health   Financial Resource Strain: Not on file  Food Insecurity: No Food Insecurity (05/20/2024)   Hunger Vital Sign    Worried About Running Out of Food in the Last Year: Never true    Ran Out of Food in the Last Year: Never true  Transportation Needs: No Transportation Needs (05/20/2024)   PRAPARE - Administrator, Civil Service (Medical): No    Lack of Transportation (Non-Medical): No  Physical Activity: Not on file  Stress: Not on file  Social Connections: Not on file   Past Medical History:  Past Medical History:  Diagnosis Date   ADHD (attention deficit hyperactivity disorder)    Anxiety    Bipolar 1 disorder (HCC)    Depression    Headache  History reviewed. No pertinent surgical history.  Current Medications: Current Facility-Administered Medications  Medication Dose Route Frequency Provider Last Rate Last Admin   acetaminophen  (TYLENOL ) tablet 650 mg  650 mg Oral Q6H PRN Onuoha, Chinwendu V, NP   650 mg at 05/22/24 0857   alum & mag hydroxide-simeth (MAALOX/MYLANTA) 200-200-20  MG/5ML suspension 30 mL  30 mL Oral Q4H PRN Onuoha, Chinwendu V, NP       ARIPiprazole  (ABILIFY ) tablet 10 mg  10 mg Oral Daily Millington, Matthew E, PA-C   10 mg at 05/24/24 9180   cholecalciferol  (VITAMIN D3) 25 MCG (1000 UNIT) tablet 1,000 Units  1,000 Units Oral Daily Millington, Matthew E, PA-C   1,000 Units at 05/24/24 9180   haloperidol  (HALDOL ) tablet 5 mg  5 mg Oral TID PRN Onuoha, Chinwendu V, NP       And   diphenhydrAMINE  (BENADRYL ) capsule 50 mg  50 mg Oral TID PRN Onuoha, Chinwendu V, NP       haloperidol  lactate (HALDOL ) injection 5 mg  5 mg Intramuscular TID PRN Onuoha, Chinwendu V, NP       And   diphenhydrAMINE  (BENADRYL ) injection 50 mg  50 mg Intramuscular TID PRN Onuoha, Chinwendu V, NP       And   LORazepam  (ATIVAN ) injection 2 mg  2 mg Intramuscular TID PRN Onuoha, Chinwendu V, NP       haloperidol  lactate (HALDOL ) injection 10 mg  10 mg Intramuscular TID PRN Onuoha, Chinwendu V, NP       And   diphenhydrAMINE  (BENADRYL ) injection 50 mg  50 mg Intramuscular TID PRN Onuoha, Chinwendu V, NP       And   LORazepam  (ATIVAN ) injection 2 mg  2 mg Intramuscular TID PRN Onuoha, Chinwendu V, NP       magnesium  hydroxide (MILK OF MAGNESIA) suspension 30 mL  30 mL Oral Daily PRN Onuoha, Chinwendu V, NP       nicotine  (NICODERM CQ  - dosed in mg/24 hours) patch 14 mg  14 mg Transdermal Daily Millington, Matthew E, PA-C   14 mg at 05/24/24 9180   nicotine  polacrilex (NICORETTE ) gum 2 mg  2 mg Oral PRN Millington, Matthew E, PA-C   2 mg at 05/23/24 1843   OXcarbazepine  (TRILEPTAL ) tablet 150 mg  150 mg Oral BID Millington, Matthew E, PA-C   150 mg at 05/24/24 9180   traZODone  (DESYREL ) tablet 50 mg  50 mg Oral QHS PRN Onuoha, Chinwendu V, NP   50 mg at 05/23/24 2104    Lab Results: No results found for this or any previous visit (from the past 48 hours).  Blood Alcohol level:  Lab Results  Component Value Date   [facility] <15 05/20/2024   ETH <10 01/04/2022    Metabolic Disorder  Labs: Lab Results  Component Value Date   HGBA1C 4.9 05/20/2024   MPG 94 05/20/2024   MPG 96.8 10/19/2021   Lab Results  Component Value Date   PROLACTIN 18.4 08/01/2021   Lab Results  Component Value Date   CHOL 136 05/20/2024   TRIG 97 05/20/2024   HDL 32 (L) 05/20/2024   CHOLHDL 4.3 05/20/2024   VLDL 19 05/20/2024   LDLCALC 85 05/20/2024   LDLCALC 104 (H) 10/19/2021     Psychiatric Specialty Exam: Appearance: Neat, well-groomed Eye Contact: Appropriate Speech: Normal rate and tone Mood: "Great" Affect: Bright, congruent Thought Process: Linear, goal-directed Thought Content: Future-oriented, no delusions Perception: Denies AVH Suicidal Ideation: Denied Homicidal Ideation: Denied  Orientation: Alert and oriented 3 Attention: Intact Memory: Grossly intact Concentration: Intact Insight: Good Judgment: Fair Psychomotor Activity:wnl  Musculoskeletal: Strength & Muscle Tone: within normal limits Gait & Station: normal Assets  Assets: Housing; Social Support    Physical Exam: Physical Exam Vitals and nursing note reviewed.  Constitutional:      Appearance: She is obese.  HENT:     Head: Atraumatic.  Eyes:     Extraocular Movements: Extraocular movements intact.  Pulmonary:     Effort: Pulmonary effort is normal.  Neurological:     Mental Status: She is alert and oriented to person, place, and time.    Review of Systems  Psychiatric/Behavioral:  Negative for hallucinations and substance abuse. The patient has insomnia.    Blood pressure 124/80, pulse 69, temperature (!) 97.4 F (36.3 C), resp. rate 20, height 5' 7 (1.702 m), weight 104.3 kg, SpO2 100%. Body mass index is 36.02 kg/m.  Diagnosis: Principal Problem:   Bipolar 1 disorder (HCC)   PLAN: Safety and Monitoring:  -- Voluntary admission to inpatient psychiatric unit for safety, stabilization and treatment  -- Daily contact with patient to assess and evaluate symptoms and progress in  treatment  -- Patient's case to be discussed in multi-disciplinary team meeting  -- Observation Level : q15 minute checks  -- Vital signs:  q12 hours  -- Precautions: suicide, elopement, and assault -- Encouraged patient to participate in unit milieu and in scheduled group therapies  2. Psychiatric Diagnoses and Treatment:  They are tolerating medications without adverse effect.  They are linear on exam.  They are denying SI, HI, and AVH.  They demonstrate more insight into the severity of the overdose.  We will continue to monitor and work toward safe discharge disposition.  Patient continues to require inpatient psychiatric hospitalization for medication management and stabilization due to risk of self injury.     Bipolar I Disorder             --  Continue abilify  10 mg daily             -- Continue trileptal  150 mg bid     -- The risks/benefits/side-effects/alternatives to this medication were discussed in detail with the patient and time was given for questions. The patient consents to medication trial.                -- Metabolic profile and EKG monitoring obtained while on an atypical antipsychotic (BMI: Lipid Panel: HbgA1c: [id number])              -- Encouraged patient to participate in unit milieu and in scheduled group therapies                            3. Medical Issues Being Addressed:   Continue Vit d3 25 mcg daily  4. Discharge Planning:   -- Social work and case management to assist with discharge planning and identification of hospital follow-up needs prior to discharge  -- Estimated LOS: 3-4 days  Hoy CHRISTELLA Pinal, NP 05/24/2024, 1:46 PM

## 2024-05-24 NOTE — Progress Notes (Signed)
   05/24/24 0900  Psych Admission Type (Psych Patients Only)  Admission Status Involuntary  Psychosocial Assessment  Patient Complaints Sleep disturbance  Eye Contact Fair  Facial Expression Animated  Affect Appropriate to circumstance  Speech Logical/coherent  Interaction Assertive  Motor Activity Other (Comment) (WNL)  Appearance/Hygiene Unremarkable  Behavior Characteristics Cooperative;Appropriate to situation  Mood Pleasant  Aggressive Behavior  Effect No apparent injury  Thought Process  Coherency WDL  Content WDL  Delusions None reported or observed  Perception WDL  Hallucination None reported or observed  Judgment Limited  Confusion None  Danger to Self  Current suicidal ideation? Denies  Agreement Not to Harm Self Yes  Description of Agreement verbal  Danger to Others  Danger to Others None reported or observed

## 2024-05-24 NOTE — Plan of Care (Signed)
   Problem: Education: Goal: Knowledge of [facility] Education information/materials will improve Outcome: Progressing Goal: Emotional status will improve Outcome: Progressing Goal: Mental status will improve Outcome: Progressing

## 2024-05-24 NOTE — Plan of Care (Signed)
  Problem: Education: Goal: Emotional status will improve Outcome: Progressing Goal: Mental status will improve Outcome: Progressing   Problem: Activity: Goal: Interest or engagement in activities will improve Outcome: Progressing Goal: Sleeping patterns will improve Outcome: Progressing   Problem: Health Behavior/Discharge Planning: Goal: Identification of resources available to assist in meeting health care needs will improve Outcome: Progressing   Problem: Physical Regulation: Goal: Ability to maintain clinical measurements within normal limits will improve Outcome: Progressing   Problem: Safety: Goal: Periods of time without injury will increase Outcome: Progressing   Problem: Coping: Goal: Coping ability will improve Outcome: Progressing   Problem: Coping: Goal: Ability to verbalize frustrations and anger appropriately will improve Outcome: Progressing Goal: Ability to demonstrate self-control will improve Outcome: Progressing   Problem: Health Behavior/Discharge Planning: Goal: Identification of resources available to assist in meeting health care needs will improve Outcome: Progressing   Problem: Safety: Goal: Periods of time without injury will increase Outcome: Progressing

## 2024-05-25 MED ORDER — ARIPIPRAZOLE 10 MG PO TABS: 10.0000 mg | ORAL_TABLET | Freq: Every day | ORAL | 0 refills | Status: AC

## 2024-05-25 MED ORDER — OXCARBAZEPINE 150 MG PO TABS: 150.0000 mg | ORAL_TABLET | Freq: Two times a day (BID) | ORAL | 0 refills | Status: AC

## 2024-05-25 NOTE — Group Note (Signed)
 [facility] LCSW Group Therapy Note   Group Date: 05/24/2024 Start Time: 1300 End Time: 1400   Type of Therapy/Topic:  Group Therapy:  Emotion Regulation  Participation Level:  Active   Mood:  Description of Group:    The purpose of this group is to assist patients in learning to regulate negative emotions and experience positive emotions. Patients will be guided to discuss ways in which they have been vulnerable to their negative emotions. These vulnerabilities will be juxtaposed with experiences of positive emotions or situations, and patients challenged to use positive emotions to combat negative ones. Special emphasis will be placed on coping with negative emotions in conflict situations, and patients will process healthy conflict resolution skills.  Therapeutic Goals: Patient will identify two positive emotions or experiences to reflect on in order to balance out negative emotions:  Patient will label two or more emotions that they find the most difficult to experience:  Patient will be able to demonstrate positive conflict resolution skills through discussion or role plays:   Summary of Patient Progress:   The patient actively engaged in the group session and contributed to a discussion on navigating complex situations that elicit challenging emotions. The group explored strategies for self-regulation and techniques to challenge negative thought patterns, promoting more positive emotions and adaptive functioning. The patient was open to feedback from peers, interacted well with both peers and the facilitator, and offered supportive input that enriched the group dynamic.    Therapeutic Modalities:   Cognitive Behavioral Therapy Feelings Identification Dialectical Behavioral Therapy   Janice CHRISTELLA Kerns, LCSW

## 2024-05-25 NOTE — Progress Notes (Signed)
  [facility] Case Management Discharge Plan :  Will you be returning to the same living situation after discharge:  Yes,  Patient to return home.  At discharge, do you have transportation home?: Yes,  Patient's grandmother to provide transportation.  Do you have the ability to pay for your medications: Yes,  [employer]  Release of information consent forms completed and in the chart;  Patient's signature needed at discharge.  Patient to Follow up at:  Follow-up Information     [facility] Follow up.   Why: In person therapy and psychiatry assessment is 05/31/24 at 11 AM. Contact information: [address] [place] [address] [phone]                 Next level of care provider has access to [facility] Link:no  Safety Planning and Suicide Prevention discussed: Yes,  Education Completed; Leotis Hugger, [phone], Mother ,  has been identified by the patient as the family member/significant other with whom the patient will be residing, and identified as the person(s) who will aid the patient in the event of a mental health crisis (suicidal ideations/suicide attempt).  With written consent from the patient, the family member/significant other has been provided the following suicide prevention education, prior to the and/or following the discharge of the patient.     Has patient been referred to the Quitline?: Patient does not use tobacco/nicotine  products  Patient has been referred for addiction treatment: No known substance use disorder.  Nihar Klus M Reeya Bound, LCSW 05/25/2024, 9:26 AM

## 2024-05-25 NOTE — Plan of Care (Signed)
 Problem: Education: Goal: Knowledge of [facility] Education information/materials will improve Outcome: Adequate for Discharge Goal: Emotional status will improve Outcome: Adequate for Discharge Goal: Mental status will improve Outcome: Adequate for Discharge Goal: Verbalization of understanding the information provided will improve Outcome: Adequate for Discharge   Problem: Activity: Goal: Interest or engagement in activities will improve Outcome: Adequate for Discharge Goal: Sleeping patterns will improve Outcome: Adequate for Discharge   Problem: Coping: Goal: Ability to verbalize frustrations and anger appropriately will improve Outcome: Adequate for Discharge Goal: Ability to demonstrate self-control will improve Outcome: Adequate for Discharge   Problem: Health Behavior/Discharge Planning: Goal: Identification of resources available to assist in meeting health care needs will improve Outcome: Adequate for Discharge Goal: Compliance with treatment plan for underlying cause of condition will improve Outcome: Adequate for Discharge   Problem: Physical Regulation: Goal: Ability to maintain clinical measurements within normal limits will improve Outcome: Adequate for Discharge   Problem: Safety: Goal: Periods of time without injury will increase Outcome: Adequate for Discharge   Problem: Education: Goal: Ability to make informed decisions regarding treatment will improve Outcome: Adequate for Discharge   Problem: Coping: Goal: Coping ability will improve Outcome: Adequate for Discharge   Problem: Health Behavior/Discharge Planning: Goal: Identification of resources available to assist in meeting health care needs will improve Outcome: Adequate for Discharge   Problem: Medication: Goal: Compliance with prescribed medication regimen will improve Outcome: Adequate for Discharge   Problem: Self-Concept: Goal: Ability to disclose and discuss suicidal ideas  will improve Outcome: Adequate for Discharge Goal: Will verbalize positive feelings about self Outcome: Adequate for Discharge   Problem: Education: Goal: Ability to state activities that reduce stress will improve Outcome: Adequate for Discharge   Problem: Coping: Goal: Ability to identify and develop effective coping behavior will improve Outcome: Adequate for Discharge   Problem: Self-Concept: Goal: Ability to identify factors that promote anxiety will improve Outcome: Adequate for Discharge Goal: Level of anxiety will decrease Outcome: Adequate for Discharge Goal: Ability to modify response to factors that promote anxiety will improve Outcome: Adequate for Discharge   Problem: Education: Goal: Utilization of techniques to improve thought processes will improve Outcome: Adequate for Discharge Goal: Knowledge of the prescribed therapeutic regimen will improve Outcome: Adequate for Discharge   Problem: Activity: Goal: Interest or engagement in leisure activities will improve Outcome: Adequate for Discharge Goal: Imbalance in normal sleep/wake cycle will improve Outcome: Adequate for Discharge   Problem: Coping: Goal: Coping ability will improve Outcome: Adequate for Discharge Goal: Will verbalize feelings Outcome: Adequate for Discharge   Problem: Health Behavior/Discharge Planning: Goal: Ability to make decisions will improve Outcome: Adequate for Discharge Goal: Compliance with therapeutic regimen will improve Outcome: Adequate for Discharge   Problem: Role Relationship: Goal: Will demonstrate positive changes in social behaviors and relationships Outcome: Adequate for Discharge   Problem: Safety: Goal: Ability to disclose and discuss suicidal ideas will improve Outcome: Adequate for Discharge Goal: Ability to identify and utilize support systems that promote safety will improve Outcome: Adequate for Discharge   Problem: Self-Concept: Goal: Will verbalize  positive feelings about self Outcome: Adequate for Discharge Goal: Level of anxiety will decrease Outcome: Adequate for Discharge   Problem: Education: Goal: Knowledge of [facility] Education information/materials will improve Outcome: Adequate for Discharge Goal: Emotional status will improve Outcome: Adequate for Discharge Goal: Mental status will improve Outcome: Adequate for Discharge Goal: Verbalization of understanding the information provided will improve Outcome: Adequate for Discharge   Problem:  Activity: Goal: Interest or engagement in activities will improve Outcome: Adequate for Discharge Goal: Sleeping patterns will improve Outcome: Adequate for Discharge   Problem: Coping: Goal: Ability to verbalize frustrations and anger appropriately will improve Outcome: Adequate for Discharge Goal: Ability to demonstrate self-control will improve Outcome: Adequate for Discharge   Problem: Health Behavior/Discharge Planning: Goal: Identification of resources available to assist in meeting health care needs will improve Outcome: Adequate for Discharge Goal: Compliance with treatment plan for underlying cause of condition will improve Outcome: Adequate for Discharge   Problem: Physical Regulation: Goal: Ability to maintain clinical measurements within normal limits will improve Outcome: Adequate for Discharge   Problem: Safety: Goal: Periods of time without injury will increase Outcome: Adequate for Discharge

## 2024-05-25 NOTE — Progress Notes (Signed)
   05/25/24 0900  Psych Admission Type (Psych Patients Only)  Admission Status Involuntary  Psychosocial Assessment  Patient Complaints None  Eye Contact Fair  Facial Expression Anxious  Affect Appropriate to circumstance  Speech Logical/coherent  Interaction Assertive  Motor Activity Slow  Appearance/Hygiene Unremarkable  Behavior Characteristics Cooperative;Appropriate to situation  Mood Pleasant  Aggressive Behavior  Effect No apparent injury  Thought Process  Coherency WDL  Content WDL  Delusions None reported or observed  Perception WDL  Hallucination None reported or observed  Judgment WDL  Confusion None  Danger to Self  Current suicidal ideation? Denies  Agreement Not to Harm Self Yes  Description of Agreement Verbal  Danger to Others  Danger to Others None reported or observed   Patient's goal for today, per her self-inventory is to communicate more.

## 2024-05-25 NOTE — Progress Notes (Signed)
 Patient ID: Janice Dixon, female   DOB: 2006/07/06, 18 y.o.   MRN: [id number]  Discharge Note:  Patient denies SI/HI/AVH at this time. Discharge instructions, AVS, prescriptions, and transition record gone over with patient. Patient given a copy of her Suicide Safety Plan. Patient agrees to comply with medication management, follow-up visit, and outpatient therapy. Patient belongings returned to patient. Patient questions and concerns addressed and answered. Patient ambulatory off unit. Patient discharged to home with her grandmother.
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
## 2024-05-25 NOTE — Progress Notes (Signed)
   05/24/24 2000  Psych Admission Type (Psych Patients Only)  Admission Status Involuntary  Psychosocial Assessment  Patient Complaints Sleep disturbance  Eye Contact Fair  Facial Expression Animated  Affect Appropriate to circumstance  Speech Logical/coherent  Interaction Assertive  Motor Activity Other (Comment) (unremarkable)  Appearance/Hygiene Unremarkable  Behavior Characteristics Appropriate to situation;Cooperative  Mood Pleasant  Aggressive Behavior  Effect No apparent injury  Thought Process  Coherency WDL  Content WDL  Delusions None reported or observed  Perception WDL  Hallucination None reported or observed  Judgment Limited  Confusion None  Danger to Self  Current suicidal ideation? Denies  Agreement Not to Harm Self Yes  Description of Agreement verbal  Danger to Others  Danger to Others None reported or observed
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
## 2024-06-18 ENCOUNTER — Ambulatory Visit: Payer: MEDICAID

## 2024-06-18 ENCOUNTER — Telehealth: Payer: Self-pay

## 2024-06-18 DIAGNOSIS — Z113 Encounter for screening for infections with a predominantly sexual mode of transmission: Secondary | ICD-10-CM

## 2024-06-18 LAB — HM HIV SCREENING LAB: HM HIV Screening: NEGATIVE

## 2024-06-18 LAB — WET PREP FOR TRICH, YEAST, CLUE
Clue Cell Exam: NEGATIVE
Trichomonas Exam: NEGATIVE
Yeast Exam: NEGATIVE

## 2024-06-18 NOTE — Progress Notes (Signed)
 Pt is here for std screening. Gaspar Garbe, RN

## 2024-06-18 NOTE — Telephone Encounter (Signed)
 Pt kept 06/18/24 [facility] provider appt.

## 2024-06-18 NOTE — Telephone Encounter (Signed)
 [facility] received CD report from plasma facility: Specimen Date= 06/14/24 Source= Plasma Test= STS v1 Result = R, preliminary Result Date= 06/17/24  Phone # on report = [phone] Address on report= [address], [place]  Call pt to discuss appt for syphilis testing with confirmatory test at [facility]. (Provider STD office visit)

## 2024-06-18 NOTE — Telephone Encounter (Signed)
 Received return call from pt and pt confirmed identity. Pt confirmed she recently went to plasma facility but has not heard anything form them re TR.  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 Confirmatory testing to make sure or confirm the result will not typically be provided by plasma facility. Discussed general info re syphilis and testing needed. Pt states she has never tested positive in the past. States that her partner stated she sometimes has mouth sores. Pt requested appt at [facility] for today.  Pt scheduled for 06/18/24 in an available provider STD appt.

## 2024-06-18 NOTE — Telephone Encounter (Signed)
 Phone call to pt at [phone]. Left message stating RN with [facility] is calling to f/u re test results. Please call Keith Felten at [phone].
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
## 2024-06-23 LAB — GONOCOCCUS CULTURE

## 2024-07-01 ENCOUNTER — Ambulatory Visit: Payer: Self-pay

## 2024-07-01 LAB — SYPHILIS SEROLOGY, ~~LOC~~ LAB
RPR, Quant: 1:2 {titer}
RPR: REACTIVE
Syphilis Treponemal Ab: NONREACTIVE

## 2024-07-01 NOTE — Progress Notes (Signed)
 Patient was referred from the [facility] center for positive RPR. Her RPR here was positive, but the treponemal test was negative. 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 Patient notified.

## 2024-09-29 ENCOUNTER — Emergency Department
Admission: EM | Admit: 2024-09-29 | Discharge: 2024-09-29 | Discharge status: Against Medical Advice | Payer: MEDICAID | Attending: Emergency Medicine | Admitting: Emergency Medicine

## 2024-09-29 ENCOUNTER — Emergency Department: Payer: MEDICAID

## 2024-09-29 DIAGNOSIS — M79642 Pain in left hand: Secondary | ICD-10-CM | POA: Insufficient documentation

## 2024-09-29 DIAGNOSIS — Z5321 Procedure and treatment not carried out due to patient leaving prior to being seen by health care provider: Secondary | ICD-10-CM | POA: Diagnosis not present

## 2024-09-29 NOTE — ED Notes (Signed)
 Called pt mom number with no answer and unable to leave message

## 2024-09-29 NOTE — ED Triage Notes (Signed)
 Pt presents to the ED via POV from home with left hand pain. Pt reports punching a tree earlier today.
